# Patient Record
Sex: Female | Born: 1975 | Race: White | Hispanic: No | Marital: Married | Smoking: Never smoker
Health system: Southern US, Community
[De-identification: ages and names within clinical notes are randomized; demographics above are authoritative.]

## PROBLEM LIST (undated history)

## (undated) DIAGNOSIS — O24419 Gestational diabetes mellitus in pregnancy, unspecified control: Secondary | ICD-10-CM

## (undated) DIAGNOSIS — I10 Essential (primary) hypertension: Secondary | ICD-10-CM

## (undated) DIAGNOSIS — Z9109 Other allergy status, other than to drugs and biological substances: Secondary | ICD-10-CM

## (undated) DIAGNOSIS — Z8632 Personal history of gestational diabetes: Secondary | ICD-10-CM

## (undated) DIAGNOSIS — E669 Obesity, unspecified: Secondary | ICD-10-CM

## (undated) DIAGNOSIS — M1990 Unspecified osteoarthritis, unspecified site: Secondary | ICD-10-CM

## (undated) HISTORY — DX: Unspecified osteoarthritis, unspecified site: M19.90

## (undated) HISTORY — DX: Gestational diabetes mellitus in pregnancy, unspecified control: O24.419

## (undated) HISTORY — DX: Other allergy status, other than to drugs and biological substances: Z91.09

---

## 1998-01-19 ENCOUNTER — Other Ambulatory Visit: Admission: RE | Admit: 1998-01-19 | Discharge: 1998-01-19 | Payer: Self-pay | Admitting: Obstetrics and Gynecology

## 1998-11-01 ENCOUNTER — Emergency Department (HOSPITAL_COMMUNITY): Admission: EM | Admit: 1998-11-01 | Discharge: 1998-11-01 | Payer: Self-pay | Admitting: Emergency Medicine

## 1998-11-01 ENCOUNTER — Encounter: Payer: Self-pay | Admitting: Emergency Medicine

## 1999-04-18 ENCOUNTER — Other Ambulatory Visit: Admission: RE | Admit: 1999-04-18 | Discharge: 1999-04-18 | Payer: Self-pay | Admitting: Obstetrics and Gynecology

## 2000-08-19 ENCOUNTER — Other Ambulatory Visit: Admission: RE | Admit: 2000-08-19 | Discharge: 2000-08-19 | Payer: Self-pay | Admitting: Obstetrics and Gynecology

## 2001-09-08 HISTORY — PX: CYSTECTOMY: SUR359

## 2001-09-20 ENCOUNTER — Other Ambulatory Visit: Admission: RE | Admit: 2001-09-20 | Discharge: 2001-09-20 | Payer: Self-pay | Admitting: Obstetrics and Gynecology

## 2001-11-12 ENCOUNTER — Encounter: Payer: Self-pay | Admitting: Obstetrics and Gynecology

## 2001-11-12 ENCOUNTER — Encounter: Admission: RE | Admit: 2001-11-12 | Discharge: 2001-11-12 | Payer: Self-pay | Admitting: Obstetrics and Gynecology

## 2001-12-17 ENCOUNTER — Encounter (INDEPENDENT_AMBULATORY_CARE_PROVIDER_SITE_OTHER): Payer: Self-pay | Admitting: *Deleted

## 2001-12-17 ENCOUNTER — Ambulatory Visit (HOSPITAL_BASED_OUTPATIENT_CLINIC_OR_DEPARTMENT_OTHER): Admission: RE | Admit: 2001-12-17 | Discharge: 2001-12-17 | Payer: Self-pay | Admitting: Surgery

## 2002-04-06 ENCOUNTER — Encounter: Payer: Self-pay | Admitting: Emergency Medicine

## 2002-04-06 ENCOUNTER — Emergency Department (HOSPITAL_COMMUNITY): Admission: EM | Admit: 2002-04-06 | Discharge: 2002-04-06 | Payer: Self-pay | Admitting: Emergency Medicine

## 2002-10-20 ENCOUNTER — Other Ambulatory Visit: Admission: RE | Admit: 2002-10-20 | Discharge: 2002-10-20 | Payer: Self-pay | Admitting: Obstetrics and Gynecology

## 2003-11-09 ENCOUNTER — Other Ambulatory Visit: Admission: RE | Admit: 2003-11-09 | Discharge: 2003-11-09 | Payer: Self-pay | Admitting: Obstetrics and Gynecology

## 2004-12-24 ENCOUNTER — Other Ambulatory Visit: Admission: RE | Admit: 2004-12-24 | Discharge: 2004-12-24 | Payer: Self-pay | Admitting: Obstetrics and Gynecology

## 2006-11-09 ENCOUNTER — Inpatient Hospital Stay (HOSPITAL_COMMUNITY): Admission: AD | Admit: 2006-11-09 | Discharge: 2006-11-09 | Payer: Self-pay | Admitting: Obstetrics and Gynecology

## 2007-01-07 ENCOUNTER — Inpatient Hospital Stay (HOSPITAL_COMMUNITY): Admission: AD | Admit: 2007-01-07 | Discharge: 2007-01-07 | Payer: Self-pay | Admitting: Obstetrics and Gynecology

## 2007-01-14 ENCOUNTER — Encounter: Admission: RE | Admit: 2007-01-14 | Discharge: 2007-01-14 | Payer: Self-pay | Admitting: Obstetrics and Gynecology

## 2007-02-16 ENCOUNTER — Inpatient Hospital Stay (HOSPITAL_COMMUNITY): Admission: AD | Admit: 2007-02-16 | Discharge: 2007-02-17 | Payer: Self-pay | Admitting: Obstetrics and Gynecology

## 2007-02-21 ENCOUNTER — Inpatient Hospital Stay (HOSPITAL_COMMUNITY): Admission: AD | Admit: 2007-02-21 | Discharge: 2007-02-24 | Payer: Self-pay | Admitting: Obstetrics and Gynecology

## 2007-09-09 HISTORY — PX: CHOLECYSTECTOMY: SHX55

## 2008-01-30 ENCOUNTER — Encounter (INDEPENDENT_AMBULATORY_CARE_PROVIDER_SITE_OTHER): Payer: Self-pay | Admitting: General Surgery

## 2008-01-30 ENCOUNTER — Observation Stay (HOSPITAL_COMMUNITY): Admission: EM | Admit: 2008-01-30 | Discharge: 2008-01-31 | Payer: Self-pay | Admitting: Emergency Medicine

## 2010-01-14 ENCOUNTER — Observation Stay (HOSPITAL_COMMUNITY): Admission: EM | Admit: 2010-01-14 | Discharge: 2010-01-15 | Payer: Self-pay | Admitting: Emergency Medicine

## 2010-01-15 ENCOUNTER — Encounter (INDEPENDENT_AMBULATORY_CARE_PROVIDER_SITE_OTHER): Payer: Self-pay | Admitting: Internal Medicine

## 2010-01-21 ENCOUNTER — Ambulatory Visit: Payer: Self-pay | Admitting: Family Medicine

## 2010-01-21 DIAGNOSIS — R51 Headache: Secondary | ICD-10-CM

## 2010-01-21 DIAGNOSIS — E669 Obesity, unspecified: Secondary | ICD-10-CM | POA: Insufficient documentation

## 2010-01-21 DIAGNOSIS — R209 Unspecified disturbances of skin sensation: Secondary | ICD-10-CM

## 2010-01-21 DIAGNOSIS — R519 Headache, unspecified: Secondary | ICD-10-CM | POA: Insufficient documentation

## 2010-01-21 DIAGNOSIS — R32 Unspecified urinary incontinence: Secondary | ICD-10-CM

## 2010-01-22 ENCOUNTER — Ambulatory Visit: Payer: Self-pay | Admitting: Family Medicine

## 2010-01-22 LAB — CONVERTED CEMR LAB: Sed Rate: 29 mm/hr — ABNORMAL HIGH (ref 0–22)

## 2010-01-24 ENCOUNTER — Telehealth: Payer: Self-pay | Admitting: Family Medicine

## 2010-01-30 ENCOUNTER — Ambulatory Visit: Payer: Self-pay | Admitting: Family Medicine

## 2010-01-30 DIAGNOSIS — R059 Cough, unspecified: Secondary | ICD-10-CM | POA: Insufficient documentation

## 2010-01-30 DIAGNOSIS — F411 Generalized anxiety disorder: Secondary | ICD-10-CM | POA: Insufficient documentation

## 2010-01-30 DIAGNOSIS — R05 Cough: Secondary | ICD-10-CM

## 2010-01-31 ENCOUNTER — Telehealth: Payer: Self-pay | Admitting: Internal Medicine

## 2010-02-01 ENCOUNTER — Telehealth: Payer: Self-pay | Admitting: Family Medicine

## 2010-02-19 ENCOUNTER — Ambulatory Visit: Payer: Self-pay | Admitting: Family Medicine

## 2010-02-19 DIAGNOSIS — F329 Major depressive disorder, single episode, unspecified: Secondary | ICD-10-CM

## 2010-02-19 DIAGNOSIS — F339 Major depressive disorder, recurrent, unspecified: Secondary | ICD-10-CM | POA: Insufficient documentation

## 2010-08-21 ENCOUNTER — Ambulatory Visit: Payer: Self-pay | Admitting: Family Medicine

## 2010-10-08 NOTE — Assessment & Plan Note (Signed)
Summary: BRAND NEW PT/TO EST/CJR PT RSC/NJR   Vital Signs:  Patient profile:   35 year old female Menstrual status:  regular LMP:     01/07/2010 Height:      61.25 inches Weight:      200 pounds BMI:     37.62 Temp:     97.9 degrees F oral Pulse rate:   80 / minute Pulse rhythm:   regular Resp:     12 per minute BP sitting:   140 / 78  (left arm) Cuff size:   large  Vitals Entered By: Sid Falcon LPN (Jan 21, 2010 2:29 PM)  Nutrition Counseling: Patient's BMI is greater than 25 and therefore counseled on weight management options.  Serial Vital Signs/Assessments:  Time      Position  BP       Pulse  Resp  Temp     By 2:47 PM             122/82                         Sid Falcon LPN  LMP (date): 01/07/2010     Menstrual Status regular Enter LMP: 01/07/2010 Last PAP Result normal   History of Present Illness: Patient is seen to establish care.  Recent hospitalization. Patient presented with right-sided numbness. She was admitted 5-9 -2011 for one day. She had multiple studies including EEG, 2-D echocardiogram, carotid Dopplers, chest x-ray, CT of the head, an MRI scan of the head which were all unremarkable. Lab work included lipids with cholesterol 189 triglycerides 237 HDL 33. A1c 6.1%. CBC and chemistries normal. Patient's symptoms were resolving and she was discharged home. Symptoms have basically resolved since then.  She recalled onset prior to admission of right facial numbness which eventually generalized right upper extremity and lower extremity. Slight slurred speech. No confusion. No headache. No significant associated weakness.  Patient has history of gestational diabetes. Otherwise no chronic medical problems. Cholecystectomy 2009. No prescription medications.  Long hx of obesity much of her life.    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 Smoked Alcohol use-no Regular exercise-no One pregnancy, one live births  Risk Factors: Alcohol Use: 0 (01/21/2010) Exercise: no (01/21/2010)  Risk Factors: Smoking Status: never (01/21/2010)  Past Medical History: Arthritis Gestational diabetes Hay Fever, allergies  Past Surgical History: Cholecystectomy 2009 Cyst removed from left breast 2003  Family History: Family History of Arthritis, parents, grandparents Mothers sister, breast cancer paternal grandfather, heart disease maternal grandfather, stroke, HBP, diabetes  Social History: Occupation:  Futures trader Married Never Smoked Alcohol use-no Regular exercise-no One pregnancy, one live births Smoking Status:  never Occupation:  employed Does Patient Exercise:  no  Review of Systems  The patient denies anorexia, fever, weight loss, weight gain, vision loss, decreased hearing, chest pain, syncope, dyspnea on exertion, peripheral edema, prolonged cough, headaches, hemoptysis, abdominal pain, melena, hematochezia, and severe indigestion/heartburn.    Physical Exam  General:  Well-developed,well-nourished,in no acute distress; alert,appropriate and cooperative throughout examination Head:  Normocephalic and atraumatic without obvious  abnormalities. No apparent alopecia or balding. Eyes:  pupils equal, pupils round, and pupils reactive to light.   Mouth:  Oral mucosa and oropharynx without lesions or exudates.  Teeth in good repair. Neck:  No deformities, masses, or tenderness noted. Lungs:  Normal respiratory effort, chest  expands symmetrically. Lungs are clear to auscultation, no crackles or wheezes. Heart:  normal rate, regular rhythm, and no murmur.   Extremities:  no edema Neurologic:  alert & oriented X3, cranial nerves II-XII intact, strength normal in all extremities, sensation intact to light touch, gait normal, DTRs symmetrical and normal, finger-to-nose normal, and toes down bilaterally on Babinski.   Skin:  no rashes.   Cervical Nodes:  No lymphadenopathy noted Psych:  normally interactive, good eye contact, not anxious appearing, and not depressed appearing.     Impression & Recommendations:  Problem # 1:  PARESTHESIA (ICD-782.0) resolved.  Etiology unclear.  no evidence for CVA.  ?vasculitic. Orders: T-Antinuclear Antib (ANA) [phone]) Venipuncture (27253) TLB-Sedimentation Rate (ESR) (85652-ESR)  Problem # 2:  OBESITY (ICD-278.00) discussed weight loss.  At risk for Type 2 DM with hx gestational diabetes.  Complete Medication List: 1)  One-a-day Womens Formula Tabs (Multiple vitamins-calcium) .... Once daily 2)  Calcium Carbonate 600 Mg Tabs (Calcium carbonate) .... Once daily 3)  Allegra 180 Mg Tabs (Fexofenadine hcl) .... As needed 4)  Vit C  .... Once daily  Patient Instructions: 1)  follow up immediately if you have any recurrent right face or extremity numbness or weakness or any other focal neurologic symptoms  Preventive Care Screening  Pap Smear:    Date:  04/08/2008    Results:  normal

## 2010-10-08 NOTE — Progress Notes (Signed)
  Phone Note Call from Patient   Caller: Patient Summary of Call: persistent depressive symptoms.  Pt feels need to consider antidepressant at this time. No suicidal ideation.  Start sertraline and follow up to reassess in 3 weeks. Initial call taken by: Evelena Peat MD,  Feb 01, 2010 2:02 PM    New/Updated Medications: SERTRALINE HCL 50 MG TABS (SERTRALINE HCL) one by mouth once daily Prescriptions: SERTRALINE HCL 50 MG TABS (SERTRALINE HCL) one by mouth once daily  #30 x 0   Entered and Authorized by:   Evelena Peat MD   Signed by:   Evelena Peat MD on 02/01/2010   Method used:   Electronically to        [facility]  [phone]* (retail)       [address]       [place], [place]  [address]       Ph: [phone] or [phone]       Fax: [phone]   RxID:   [id number]

## 2010-10-08 NOTE — Progress Notes (Signed)
Summary: cough  Phone Note Call from Patient   Caller: Patient Call For: Evelena Peat MD Summary of Call: [phone] Pt has been vomiting since taking the Hydrocone cough syrup.  Can we give her a different RX? Nicolette Bang ( [facility])  Initial call taken by: Lynann Beaver CMA,  Jan 31, 2010 10:36 AM  Follow-up for Phone Call        generic tessalon 200 mg  #30 one every 8 hours Follow-up by: Gordy Savers  MD,  Jan 31, 2010 10:39 AM    New/Updated Medications: TESSALON 200 MG CAPS (BENZONATATE) one by mouth q 8 hours. Prescriptions: TESSALON 200 MG CAPS (BENZONATATE) one by mouth q 8 hours.  #30 x 0   Entered by:   Lynann Beaver CMA   Authorized by:   Gordy Savers  MD   Signed by:   Lynann Beaver CMA on 01/31/2010   Method used:   Electronically to        [facility]  [phone]* (retail)       [address]       [place], [place]  [address]       Ph: [phone] or [phone]       Fax: [phone]   RxID:   [id number]  Pt. notifed.
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
## 2010-10-08 NOTE — Assessment & Plan Note (Signed)
Summary: recurrent symptoms/dm   Vital Signs:  Patient profile:   35 year old female Menstrual status:  regular Temp:     98.2 degrees F oral BP sitting:   110 / 74  (left arm) Cuff size:   large  Vitals Entered By: Sid Falcon LPN (Jan 22, 2010 10:13 AM)   History of Present Illness: Patient seen with persistent dry cough. Just seen yesterday and referto note yesterday for details regarding recent hospitalization. Last night around 7 PM she noticed possible episode of her heart racing but denied any syncope or dizziness. She had tingling involving right face and left ear but no weakness and no recurrent slurred speech. She hady bifrontal headache. Her cough is in mostly dry and not relieved with Robitussin or Mucinex. She has frequent postnasal drip symptoms and congestion. Inconsistent use of antihistamine. No recent decongestant use. Denies GERD symptoms. No purulent nasal discharge. Denies fever or chills. Recent chest x-ray unremarkable.  Denies any recurrent extremity tingling, numbness, or weakness. Nonsmoker.  Allergies (verified): No Known Drug Allergies  Past History:  Past Surgical History: Last updated: 01/21/2010 Cholecystectomy 2009 Cyst removed from left breast 2003  Review of Systems      See HPI  Physical Exam  General:  Well-developed,well-nourished,in no acute distress; alert,appropriate and cooperative throughout examination Ears:  External ear exam shows no significant lesions or deformities.  Otoscopic examination reveals clear canals, tympanic membranes are intact bilaterally without bulging, retraction, inflammation or discharge. Hearing is grossly normal bilaterally. Nose:  External nasal examination shows no deformity or inflammation. Nasal mucosa are pink and moist without lesions or exudates. Mouth:  Oral mucosa and oropharynx without lesions or exudates.  Teeth in good repair. Neck:  No deformities, masses, or tenderness noted. Lungs:  Normal  respiratory effort, chest expands symmetrically. Lungs are clear to auscultation, no crackles or wheezes. Heart:  Normal rate and regular rhythm. S1 and S2 normal without gallop, murmur, click, rub or other extra sounds. Neurologic:  alert & oriented X3, cranial nerves II-XII intact, and strength normal in all extremities.     Impression & Recommendations:  Problem # 1:  COUGH (ICD-786.2) suspect this may be related to allergic postnasal drip. Recent chest x-ray unremarkable. Regular use of antihistamine and add fluticasone nasal.  Complete Medication List: 1)  One-a-day Womens Formula Tabs (Multiple vitamins-calcium) .... Once daily 2)  Calcium Carbonate 600 Mg Tabs (Calcium carbonate) .... Once daily 3)  Allegra 180 Mg Tabs (Fexofenadine hcl) .... As needed 4)  Vit C  .... Once daily 5)  Fluticasone Propionate 50 Mcg/act Susp (Fluticasone propionate) .... 2 sprays per nostril once daily as needed  Patient Instructions: 1)  use Claritin or Allegra once daily regularly for the next couple of weeks 2)  Add nasal allergy spray daily 3)  Be in touch if nasal congestive symptoms or cough not improving over the next couple of weeks Prescriptions: FLUTICASONE PROPIONATE 50 MCG/ACT SUSP (FLUTICASONE PROPIONATE) 2 sprays per nostril once daily as needed  #1 x 5   Entered and Authorized by:   Evelena Peat MD   Signed by:   Evelena Peat MD on 01/22/2010   Method used:   Electronically to        [facility]  [phone]* (retail)       [address]       [place], [place]  [address]       Ph: [phone] or [phone]  Fax: [phone]   RxID:   [id number]

## 2010-10-08 NOTE — Assessment & Plan Note (Signed)
Summary: Follow up/cb/pt rsc/cjr   Vital Signs:  Patient profile:   35 year old female Menstrual status:  regular Weight:      197 pounds Temp:     98.1 degrees F oral BP sitting:   120 / 80  (left arm) Cuff size:   large  Vitals Entered By: Sid Falcon LPN (February 19, 2010 8:49 AM) CC: Follow-up visit, new med   History of Present Illness: Cough improved with Tessalon.  No fever, chills, or dyspnea.  Recent increase in depressive symptoms.  We started Zoloft and she is improved-better sleep, less anxious, and less depressed .  No side effects from med.   No suicidal ideation.  Improved motivation and more positive outlook.  Allergies (verified): No Known Drug Allergies  Past History:  Past Medical History: Last updated: 01/21/2010 Arthritis Gestational diabetes Hay Fever, allergies  Review of Systems  The patient denies anorexia, weight loss, and weight gain.    Physical Exam  General:  Well-developed,well-nourished,in no acute distress; alert,appropriate and cooperative throughout examination Lungs:  Normal respiratory effort, chest expands symmetrically. Lungs are clear to auscultation, no crackles or wheezes. Heart:  Normal rate and regular rhythm. S1 and S2 normal without gallop, murmur, click, rub or other extra sounds. Psych:  normally interactive, good eye contact, not anxious appearing, and not depressed appearing.     Impression & Recommendations:  Problem # 1:  COUGH, CHRONIC (ICD-786.2) Assessment Improved almost resolved.  Problem # 2:  DEPRESSION (ICD-311) Assessment: Improved cont Zoloft and reassess in 6 months. Her updated medication list for this problem includes:    Sertraline Hcl 50 Mg Tabs (Sertraline hcl) ..... One by mouth once daily  Complete Medication List: 1)  One-a-day Womens Formula Tabs (Multiple vitamins-calcium) .... Once daily 2)  Calcium Carbonate 600 Mg Tabs (Calcium carbonate) .... Once daily 3)  Allegra 180 Mg Tabs  (Fexofenadine hcl) .... As needed 4)  Vit C  .... Once daily 5)  Fluticasone Propionate 50 Mcg/act Susp (Fluticasone propionate) .... 2 sprays per nostril once daily as needed 6)  Hydrocodone-homatropine 5-1.5 Mg/65ml Syrp (Hydrocodone-homatropine) .... One tsp by mouth q4-6 hours as needed cough 7)  Tessalon 200 Mg Caps (Benzonatate) .... One by mouth q 8 hours. 8)  Sertraline Hcl 50 Mg Tabs (Sertraline hcl) .... One by mouth once daily  Patient Instructions: 1)  Please schedule a follow-up appointment in 6 months .  Prescriptions: SERTRALINE HCL 50 MG TABS (SERTRALINE HCL) one by mouth once daily  #30 x 6   Entered and Authorized by:   Evelena Peat MD   Signed by:   Evelena Peat MD on 02/19/2010   Method used:   Electronically to        [facility]  [phone]* (retail)       [address]       [place], [place]  [address]       Ph: [phone] or [phone]       Fax: [phone]   RxID:   [id number]

## 2010-10-08 NOTE — Progress Notes (Signed)
Summary: Lab results  Phone Note Call from Patient   Caller: Patient Call For: Evelena Peat MD Summary of Call: Pt. is calling for lab results. [phone] Initial call taken by: Lynann Beaver CMA,  Jan 24, 2010 9:09 AM  Follow-up for Phone Call        Pt informed Follow-up by: Sid Falcon LPN,  Jan 24, 2010 12:08 PM

## 2010-11-26 LAB — CBC
HCT: 39.5 % (ref 36.0–46.0)
Hemoglobin: 13.6 g/dL (ref 12.0–15.0)
MCHC: 34.5 g/dL (ref 30.0–36.0)
RBC: 4.62 MIL/uL (ref 3.87–5.11)
WBC: 8.1 10*3/uL (ref 4.0–10.5)

## 2010-11-26 LAB — CK TOTAL AND CKMB (NOT AT ARMC)
CK, MB: 1.9 ng/mL (ref 0.3–4.0)
CK, MB: 2.5 ng/mL (ref 0.3–4.0)
Relative Index: 1.6 (ref 0.0–2.5)
Total CK: 142 U/L (ref 7–177)
Total CK: 159 U/L (ref 7–177)

## 2010-11-26 LAB — LIPID PANEL
Cholesterol: 189 mg/dL (ref 0–200)
HDL: 33 mg/dL — ABNORMAL LOW (ref >39)
Total CHOL/HDL Ratio: 5.7 RATIO
VLDL: 47 mg/dL — ABNORMAL HIGH (ref 0–40)

## 2010-11-26 LAB — URINALYSIS, ROUTINE W REFLEX MICROSCOPIC
Ketones, ur: NEGATIVE mg/dL
Leukocytes, UA: NEGATIVE
Nitrite: NEGATIVE

## 2010-11-26 LAB — COMPREHENSIVE METABOLIC PANEL
CO2: 23 mEq/L (ref 19–32)
GFR calc non Af Amer: >60 mL/min (ref >60)
Potassium: 4.8 mEq/L (ref 3.5–5.1)
Total Bilirubin: 0.6 mg/dL (ref 0.3–1.2)
Total Protein: 7.8 g/dL (ref 6.0–8.3)

## 2010-11-26 LAB — GLUCOSE, CAPILLARY: Glucose-Capillary: 105 mg/dL — ABNORMAL HIGH (ref 70–99)

## 2010-11-26 LAB — PROTIME-INR
INR: 1 (ref 0.00–1.49)
Prothrombin Time: 13.1 seconds (ref 11.6–15.2)

## 2010-11-26 LAB — APTT: aPTT: 29 seconds (ref 24–37)

## 2010-11-26 LAB — URINE MICROSCOPIC-ADD ON

## 2010-11-26 LAB — CARDIAC PANEL(CRET KIN+CKTOT+MB+TROPI)
CK, MB: 1.9 ng/mL (ref 0.3–4.0)
Relative Index: 1.6 (ref 0.0–2.5)
Troponin I: 0.01 ng/mL (ref 0.00–0.06)

## 2010-11-26 LAB — TROPONIN I: Troponin I: 0.01 ng/mL (ref 0.00–0.06)

## 2010-11-26 LAB — HEMOGLOBIN A1C: Hgb A1c MFr Bld: 6.1 % — ABNORMAL HIGH (ref <5.7)

## 2010-11-26 LAB — DIFFERENTIAL
Eosinophils Absolute: 0.1 10*3/uL (ref 0.0–0.7)
Monocytes Absolute: 0.5 10*3/uL (ref 0.1–1.0)
Neutrophils Relative %: 64 % (ref 43–77)

## 2011-01-21 NOTE — H&P (Signed)
[name], Anderson               ACCOUNT NO.:  [id number]   MEDICAL RECORD NO.:  [id number]          PATIENT TYPE:  OBV   LOCATION:  0098                         FACILITY:  [facility]   PHYSICIAN:  Anselm Pancoast. Weatherly, M.D.DATE OF BIRTH:  10/06/1975   DATE OF ADMISSION:  01/30/2008  DATE OF DISCHARGE:                              HISTORY & PHYSICAL   PREOPERATIVE DIAGNOSIS:  Acute cholecystitis with stones.   POSTOP DIAGNOSIS:  Acute cholecystitis with stones.   OPERATIONS:  Laparoscopic cholecystectomy with cholangiogram under  general anesthesia.   SURGEONS:  1. Dr. Consuello Bossier.  2. Assistant, Dr. Baruch Merl.   CHIEF COMPLAINT:  Epigastric pain.   HISTORY:  Tracey Anderson is a 35 year old Caucasian female who presented  to the emergency room at approximately 3:30 a.m. with the following  history:  She had had previous episodes of pain that had subsided, all  kind of epigastric and occurred postprandially.  In the evening last  night she had a pizza and went to bed and then started having severe  pain first in the epigastric area and then kind of in her chest.  It was  severe and then it would kind of come intermittently.  When she  presented to the ER, she was seen by the ER physician who found that she  was not febrile.  Had laboratory studies drawn.  White count was 8,700  with 73% neutrophils, and then a CMET was performed.  The CMET showed  mildly elevated SGOT and SGPT; bilirubin was normal, and her glucose was  unremarkable.  EKG showed normal sinus rhythm, and a chest x-ray was  also performed.  With this being completed, she was given Dilaudid, and  an ultrasound of the gallbladder was obtained.  This showed a distended  gallbladder with small stones in it, and one appeared up over the neck  of the gallbladder.  The common bile duct was not definitely dilated,  and a surgical consultation was called this morning at approximately  7:15.  They had originally  thought about letting her go home, but then  she appeared to be having reoccurring episodes of significant pain.  When I saw her probably at about 7:45, she was still uncomfortable; said  the pressure was reoccurring.  Of course she had been n.p.o. since her  pizza last night and is in agreement to proceed with the laparoscopic  cholecystectomy at this time.  Her past medical history is  noncontributory with the exception that she has been on birth control  pills.  She has an 87-month-old daughter.  These episodes of pain  started occurring after her pregnancy.  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   ALLERGIES:  None.   CHRONIC MEDICATIONS:  None.   PHYSICAL EXAMINATION:  When I saw her at approximately 8 o'clock, she  was afebrile.  Pulse was not elevated.  She was having pressure symptoms  again in the upper abdomen but not what I would really consider acutely  tender in the right upper quadrant.  She has kind of a relaxed abdomen  as  if she has had a recent pregnancy, and the stretch marks are  resolving.  I did not do a pelvic or rectal examination.  There is no  significant pain radiating to the small of her back.  Her lipase and  amylase were unremarkable when measured when she first came.   IMPRESSION:  Symptomatic gallstones, possibly passage of the common duct  stone.  However, I would recommend we proceed on with a laparoscopic  cholecystectomy and cholangiogram.  She is aware of the possibility she  may need an ERCP if there is a common duct stone.  Antibiotics ordered.  OR  has been notified.           ______________________________  Anselm Pancoast. Zachery Dakins, M.D.     WJW/MEDQ  D:  01/30/2008  T:  01/30/2008  Job:  [id number]

## 2011-01-21 NOTE — Op Note (Signed)
NAMETIMEKA, GOETTE               ACCOUNT NO.:  [id number]   MEDICAL RECORD NO.:  [id number]          PATIENT TYPE:  OBV   LOCATION:  1527                         FACILITY:  [facility]   PHYSICIAN:  Anselm Pancoast. Weatherly, M.D.DATE OF BIRTH:  July 15, 1976   DATE OF PROCEDURE:  DATE OF DISCHARGE:                               OPERATIVE REPORT   PREOPERATIVE DIAGNOSIS:  Acute cholecystitis with stones.   POSTOPERATIVE DIAGNOSIS:  Acute cholecystitis with stones.   OPERATION:  Laparoscopic cholecystectomy with cholangiogram.   ANESTHESIA:  General.   HISTORY:  Tracey Anderson is a 35 year old Caucasian female who is now  postpartum, who has been having episodes of epigastric pain that would  subside after a few hours,  usually in the evening, and had one last  evening after eating pizza. The pain can become very intense to her  chest and approximately at 2 a.m. she came to the emergency room and was  seen by the ER physician at approximately 3:30.  Laboratory studies were  obtained and her SGOT and SGPT were elevated, but bilirubin was not, and  her white count was not elevated.  She was treated with pain medication.  EKG and chest x-ray ruled out any cardiac or chest problems  and an  ultrasound was performed about 6 a.m. which showed a distended  gallbladder with small stones.  Clinically, they discussed the thought  that she could go home, but then the pain reoccurred and I was asked to  see her at approximately 7 a.m.  The patient is in agreement to go ahead  and proceed with a laparoscopic cholecystectomy with cholangiogram and  understands that if  there is a common duct stone she may need an ERCP.  The patient was given 3 g of Unasyn and PAS stockings.   DESCRIPTION OF PROCEDURE:  She was taken to the operative suite,  induction of general anesthesia, endotracheal tube, oral tube to the  stomach and the abdomen was prepped with Betadine solution and draped in  a sterile manner.  She  still has got a little bit of the fatty tissue  from her pregnancy and a small incision was made below the umbilicus and  fascia was eventually identified and batched up and picked up between 2  Kochers and a small  opening carefully made through the peritoneal  cavity.  A pursestring suture of 0-Vicryl was placed and a Hasson  cannula introduced.  The gallbladder was distended and slightly  erythematous and a little bit edematous, but certainly not that of a  horrible acute cholecystitis.  The operative trocar was placed under  direct vision.  Two 5-mm trocars were placed without difficulty.  The  guidewire was tracted up in the abdomen with a hook cautery and I  carefully opened up the peritoneum over the proximal portion of the  gallbladder cystic duct and encompassed a very short cystic duct.  I  placed a clip on the cystic duct gallbladder junction and a small  opening made in the cystic duct and a Cook catheter introduced.  It was  held in place with  a clip and then cholangiogram was obtained.  The bile  duct was a little prominent but there is dye going into the distal  common bile duct.  We repositioned so we could centered it just on the  distal portions since the intrahepatic radicals had been seen at the  first injection and then repeat injections and then I could certainly  see flow going and no obvious stones impacted in the distal common bile  duct.  The catheter was then removed, three clips were placed on the  short cystic duct, taking care that the cystic duct and the common  hepatic junction was not compromised and then the cystic duct divided.  The cystic artery had been identified and doubly clipped, proximal  __________and the distal divided and then the hook electrocautery was  used to free up the gallbladder from its bed.  Good hemostasis was  obtained and the gallbladder was placed in the EndoCatch bag.  The  camera was switched to the upper 10-mm port.  The bag  containing the  gallbladder was drawn through the 5-mm port at the fascial defect of the  umbilicus and then an additional figure-of-eight 0-Vicryl suture placed  in addition to the first and both tied.  A little bit of irrigating  fluid  had been used to aspirate into the  __________clips and no  bleeding from the bile duct was seen coming from the liver bed and the  carbon dioxide was released and the 5-mm ports withdrawn.  The  subcutaneous wounds were closed with 3-0 Vicryl, 4-0 Vicryl at the  umbilicus and subxiphoid and Monocryl at the medial subcutaneous area.  Benzoin and Steri-Strips were placed on the skin.  The patient tolerated  the procedure nicely and we will let her decide whether she goes home  this evening or whether we will plan on keeping her overnight and go  home in the morning.  I will repeat a set of liver tests down the road  and I do not think there is any stone in the common bile duct at this  time, but clinically I think she probably did pass a small stone with  this severe rest chest pain, etc. that she had experienced..           ______________________________  Anselm Pancoast. Zachery Dakins, M.D.     WJW/MEDQ  D:  01/30/2008  T:  01/30/2008  Job:  [id number]

## 2011-01-24 NOTE — Op Note (Signed)
[place]  Patient:    Tracey Anderson, Tracey Anderson Visit Number: [id number] MRN: [id number]          Service Type: DSU Location: [facility] Attending Physician:  Charlton Haws Dictated by:   Currie Paris, M.D. Proc. Date: 12/17/01 Admit Date:  12/17/2001 Discharge Date: 12/17/2001   CC:         Juluis Mire, M.D.  [facility]   Operative Report  ACCOUNT NO. [id number]. CCS [id number].  PREOPERATIVE DIAGNOSIS:  Left breast mass.  POSTOPERATIVE DIAGNOSIS:  Left breast mass.  PROCEDURE:  Excision of left breast mass.  SURGEON:  Currie Paris, M.D.  ANESTHESIA:  MAC.  CLINICAL HISTORY:  This patient is a 35 year old with a fairly well- circumscribed but somewhat irregular mass in the upper inner quadrant of the left breast just almost in the midline of the breast, about two fingerbreadths above the areolar margin.  DESCRIPTION OF PROCEDURE:  The patient was seen in the holding area and had no further questions.  In the operating room the mass was identified and marked. She was then given IV sedation.  The breast was prepped with Betadine and draped as a sterile field.  Xylocaine 1% was infiltrated over the mass and once adequate local was achieved, a curvilinear incision made over the mass.  Skin and subcutaneous tissues were divided with the cautery and as I got down to where I could palpate the mass, I put a 3-0 Vicryl through it to use as a traction suture. The mass was excised in toto with a small rim of normal breast tissue and fatty tissue around it.  This was sent for permanent section.  The wound was checked for hemostasis, and that was achieved with the cautery. It was then closed in layers with 3-0 Vicryl, followed by 4-0 Monocryl subcuticular plus Steri-Strips.  The patient tolerated the procedure well. There were no operative complications.  All counts were correct. Dictated by:   Currie Paris,  M.D. Attending Physician:  Charlton Haws DD:  12/17/01 TD:  12/18/01 Job: [id number]

## 2011-06-03 LAB — ABO/RH: RH Type: POSITIVE

## 2011-06-03 LAB — GC/CHLAMYDIA PROBE AMP, GENITAL: Chlamydia: NEGATIVE

## 2011-06-03 LAB — RUBELLA ANTIBODY, IGM: Rubella: IMMUNE

## 2011-06-04 LAB — COMPREHENSIVE METABOLIC PANEL
ALT: 46 — ABNORMAL HIGH
Albumin: 3.3 — ABNORMAL LOW
BUN: 14
BUN: 4 — ABNORMAL LOW
CO2: 25
Calcium: 9.1
Creatinine, Ser: 0.65
Creatinine, Ser: 0.65
GFR calc Af Amer: >60
GFR calc non Af Amer: >60
Glucose, Bld: 149 — ABNORMAL HIGH
Sodium: 140
Total Protein: 6.1
Total Protein: 6.7

## 2011-06-04 LAB — CBC
HCT: 38.5
Hemoglobin: 13.1
MCHC: 34
MCV: 83.7
RBC: 4.6
RDW: 12.7

## 2011-06-04 LAB — POCT CARDIAC MARKERS
CKMB, poc: 1.2
Operator id: 294591
Troponin i, poc: <0.05
Troponin i, poc: <0.05

## 2011-06-04 LAB — DIFFERENTIAL
Basophils Relative: 0
Eosinophils Absolute: 0.1
Eosinophils Relative: 1
Lymphs Abs: 1.8
Monocytes Relative: 6

## 2011-06-04 LAB — LIPASE, BLOOD: Lipase: 39

## 2011-06-25 LAB — CBC
HCT: 34.5 — ABNORMAL LOW
HCT: 35.2 — ABNORMAL LOW
Hemoglobin: 11.5 — ABNORMAL LOW
Hemoglobin: 11.7 — ABNORMAL LOW
MCHC: 33.2
MCHC: 33.4
MCV: 85.9
MCV: 86.4
RBC: 4.09
RDW: 14.6 — ABNORMAL HIGH

## 2011-06-26 LAB — URINALYSIS, ROUTINE W REFLEX MICROSCOPIC
Bilirubin Urine: NEGATIVE
Hgb urine dipstick: NEGATIVE
Ketones, ur: NEGATIVE
Specific Gravity, Urine: 1.02
Urobilinogen, UA: 1

## 2011-09-09 NOTE — L&D Delivery Note (Signed)
Delivery Note  At 1:22 AM a viable female was delivered via Vaginal, Spontaneous Delivery (Presentation: ;  ).  APGAR: 4, 8; weight .   Placenta status: spont >>>path  , .  Cord:  with the following complications: .  Cord pH: sent  tight nuchal cord X 2, clamped + cut, bulb suction + assis vent w/ O2 until NICU team arrived Anesthesia: Epidural  Episiotomy: None Lacerations: first deg Suture Repair: 3.0 chromic Est. Blood Loss (mL): 300cc  Mom to postpartum.  Baby to nursery-stable.  Meriel Pica 12/07/2011, 1:37 AM

## 2011-10-16 ENCOUNTER — Other Ambulatory Visit: Payer: Self-pay

## 2011-10-16 ENCOUNTER — Emergency Department (HOSPITAL_COMMUNITY)
Admission: EM | Admit: 2011-10-16 | Discharge: 2011-10-16 | Disposition: A | Discharge status: Home / Self Care | Payer: BC Managed Care – PPO | Attending: Emergency Medicine | Admitting: Emergency Medicine

## 2011-10-16 ENCOUNTER — Encounter (HOSPITAL_COMMUNITY): Payer: Self-pay

## 2011-10-16 DIAGNOSIS — Z79899 Other long term (current) drug therapy: Secondary | ICD-10-CM | POA: Insufficient documentation

## 2011-10-16 DIAGNOSIS — M129 Arthropathy, unspecified: Secondary | ICD-10-CM | POA: Insufficient documentation

## 2011-10-16 DIAGNOSIS — O99891 Other specified diseases and conditions complicating pregnancy: Secondary | ICD-10-CM | POA: Insufficient documentation

## 2011-10-16 DIAGNOSIS — R42 Dizziness and giddiness: Secondary | ICD-10-CM | POA: Insufficient documentation

## 2011-10-16 HISTORY — DX: Personal history of gestational diabetes: Z86.32

## 2011-10-16 LAB — CBC
MCH: 28.2 pg (ref 26.0–34.0)
MCV: 85.4 fL (ref 78.0–100.0)
Platelets: 188 10*3/uL (ref 150–400)
RBC: 4.18 MIL/uL (ref 3.87–5.11)

## 2011-10-16 LAB — DIFFERENTIAL
Basophils Relative: 0 % (ref 0–1)
Eosinophils Relative: 1 % (ref 0–5)
Lymphs Abs: 1.7 10*3/uL (ref 0.7–4.0)
Monocytes Absolute: 0.4 10*3/uL (ref 0.1–1.0)

## 2011-10-16 LAB — URINALYSIS, ROUTINE W REFLEX MICROSCOPIC
Bilirubin Urine: NEGATIVE
Glucose, UA: NEGATIVE mg/dL
Ketones, ur: 15 mg/dL — AB
pH: 6.5 (ref 5.0–8.0)

## 2011-10-16 MED ORDER — SODIUM CHLORIDE 0.9 % IV BOLUS (SEPSIS)
500.0000 mL | Freq: Once | INTRAVENOUS | Status: AC
  Administered 2011-10-16: 500 mL via INTRAVENOUS

## 2011-10-16 NOTE — ED Notes (Signed)
RR OB RN called to come and see pt. Materials at the bedside.

## 2011-10-16 NOTE — ED Notes (Signed)
Per EMS pt is [redacted] weeks pregnant and has hx gestational diabetes with first pregnancy. Called out for dizziness, lightheadedness, and blurred vision. Pt's blood sugar at home was 93, EMS checked it and it was 85. Pt's last check up was on January 31st. No complications with pregnancy. Pt was having severe back pain yesterday. Pt sts baby is moving appropriately.

## 2011-10-16 NOTE — Progress Notes (Signed)
OB RR RN at bedside to assess pt. Pt c/o dizziness this am and blurred vision. None at this time. Pt denies vaginal bleeding, leaking of fluid and reports positive fetal movement. Pt is seen by Dr. Arelia Sneddon, last visit 10/09/2011. Pt states that she was tested for Gestational DM with 1 hour glucola and did not pass. Pt has not scheduled a repeat evaluation. Pt has history of GDM with previous pregnancy in 2008.

## 2011-10-16 NOTE — Progress Notes (Signed)
[facility] ED called OB RR RN about 29 week pt regarding dizziness and blurred vision.

## 2011-10-16 NOTE — ED Notes (Signed)
Pt sts the symptoms are gone for now. She was surprised when she checked her sugar this morning and found that her sugar had dropped from the time that she had eaten to the time that EMS arrived and checked it again.

## 2011-10-16 NOTE — Progress Notes (Signed)
Rolly Salter, ED RN notified of pt being cleared OB related but if concerns arise to call Dr. Vincente Poli regarding pregnancy.

## 2011-10-16 NOTE — ED Provider Notes (Signed)
Medical screening examination/treatment/procedure(s) were conducted as a shared visit with non-physician practitioner(s) and myself.  I personally evaluated the patient during the encounter   Tracey Anderson L Tracey Sartwell, MD 10/16/11 1520 

## 2011-10-16 NOTE — ED Provider Notes (Signed)
History     CSN: [id number]  Arrival date & time 10/16/11  1022   First MD Initiated Contact with Patient 10/16/11 1101      Chief Complaint  Patient presents with  . Dizziness    (Consider location/radiation/quality/duration/timing/severity/associated sxs/prior treatment) Patient is a 36 y.o. female presenting with weakness. The history is provided by the patient.  Weakness Primary symptoms do not include fever. The symptoms began 12 to 24 hours ago. The symptoms are unchanged. Context: she reports becoming lightheaded when she goes to stand, better at rest. No syncope.   Associated symptoms comments: She is [redacted] weeks pregnant in an uncomplicated pregnancy and denies having abdominal pain, vaginal bleeding or discharge. Baby remains active. She reports gestational diabetes with first pregnancy and that her CBG this morning after onset of symptoms was 95. Later when checked in was 85. No vomiting, nausea, fever, or other symptoms..    Past Medical History  Diagnosis Date  . Arthritis   . Gestational diabetes   . Environmental allergies   . Hx gestational diabetes     Past Surgical History  Procedure Date  . Cholecystectomy 2009  . Cystectomy 2003    left breast    Family History  Problem Relation Age of Onset  . Cancer Mother     breast cancer  . Cancer Sister     breast cancer  . Hypertension Maternal Grandfather   . Diabetes Maternal Grandfather   . Stroke Maternal Grandfather   . Heart disease Paternal Grandfather     History  Substance Use Topics  . Smoking status: Never Smoker   . Smokeless tobacco: Not on file  . Alcohol Use: No    OB History    Grav Para Term Preterm Abortions TAB SAB Ect Mult Living   2 1 1   0     1      Review of Systems  Constitutional: Negative for fever and chills.  HENT: Negative.   Respiratory: Negative.   Cardiovascular: Negative.   Gastrointestinal: Negative.   Musculoskeletal: Negative.   Skin: Negative.     Neurological: Positive for light-headedness.    Allergies  Review of patient's allergies indicates no known allergies.  Home Medications   Current Outpatient Rx  Name Route Sig Dispense Refill  . VITAMIN C PO Oral Take by mouth.      . BENZONATATE 200 MG PO CAPS Oral Take 200 mg by mouth every 8 (eight) hours.      Marland Kitchen CALCIUM CARBONATE 600 MG PO TABS Oral Take 600 mg by mouth daily.      Marland Kitchen FEXOFENADINE HCL 180 MG PO TABS Oral Take 180 mg by mouth as needed.      Marland Kitchen FLUTICASONE PROPIONATE 50 MCG/ACT NA SUSP  2 sprays per nostril once daily as needed     . HYDROCODONE-HOMATROPINE 5-1.5 MG/5ML PO SYRP  One tsp by mouth every 4-6 hours as needed for cough     . ONE-A-DAY WOMENS PO TABS Oral Take 1 tablet by mouth daily.      Marland Kitchen PRENATAL MULTIVITAMIN CH Oral Take 1 tablet by mouth daily.    . SERTRALINE HCL 50 MG PO TABS Oral Take 50 mg by mouth daily.        BP 104/51  Pulse 95  Temp(Src) 97.5 F (36.4 C) (Oral)  Resp 18  Ht 5\' 1"  (1.549 m)  Wt 209 lb 4 oz (94.915 kg)  BMI 39.54 kg/m2  SpO2 100%  Physical Exam  Constitutional: She is oriented to person, place, and time. She appears well-developed and well-nourished.  HENT:  Head: Normocephalic.  Neck: Normal range of motion. Neck supple.  Cardiovascular: Normal rate and regular rhythm.   Pulmonary/Chest: Effort normal and breath sounds normal.  Abdominal: Soft. Bowel sounds are normal. There is no tenderness. There is no rebound and no guarding.       Abdomen gravid and nontender.  Musculoskeletal: Normal range of motion.  Neurological: She is alert and oriented to person, place, and time. No cranial nerve deficit. Coordination normal.  Skin: Skin is warm and dry. No rash noted.  Psychiatric: She has a normal mood and affect.    ED Course  Procedures (including critical care time)  Labs Reviewed - No data to display No results found.   No diagnosis found.    MDM  OB response nurse reports fetal monitor appears  normal. Baby active, normal heart rate, and no contractions.        Rodena Medin, PA-C 10/16/11 1327

## 2011-12-05 ENCOUNTER — Inpatient Hospital Stay (HOSPITAL_COMMUNITY)
Admission: AD | Admit: 2011-12-05 | Discharge: 2011-12-08 | DRG: 373 | Disposition: A | Discharge status: Home / Self Care | Payer: BC Managed Care – PPO | Source: Ambulatory Visit | Attending: Obstetrics and Gynecology | Admitting: Obstetrics and Gynecology

## 2011-12-05 DIAGNOSIS — E669 Obesity, unspecified: Secondary | ICD-10-CM

## 2011-12-05 DIAGNOSIS — F411 Generalized anxiety disorder: Secondary | ICD-10-CM

## 2011-12-05 DIAGNOSIS — O321XX0 Maternal care for breech presentation, not applicable or unspecified: Secondary | ICD-10-CM

## 2011-12-05 DIAGNOSIS — R209 Unspecified disturbances of skin sensation: Secondary | ICD-10-CM

## 2011-12-05 DIAGNOSIS — F329 Major depressive disorder, single episode, unspecified: Secondary | ICD-10-CM

## 2011-12-05 DIAGNOSIS — R32 Unspecified urinary incontinence: Secondary | ICD-10-CM

## 2011-12-05 DIAGNOSIS — R05 Cough: Secondary | ICD-10-CM

## 2011-12-05 DIAGNOSIS — R51 Headache: Secondary | ICD-10-CM

## 2011-12-06 ENCOUNTER — Encounter (HOSPITAL_COMMUNITY): Payer: Self-pay | Admitting: Anesthesiology

## 2011-12-06 ENCOUNTER — Inpatient Hospital Stay (HOSPITAL_COMMUNITY): Payer: BC Managed Care – PPO | Admitting: Anesthesiology

## 2011-12-06 ENCOUNTER — Encounter (HOSPITAL_COMMUNITY): Payer: Self-pay | Admitting: *Deleted

## 2011-12-06 ENCOUNTER — Inpatient Hospital Stay (HOSPITAL_COMMUNITY): Payer: BC Managed Care – PPO

## 2011-12-06 LAB — CBC
HCT: 34.3 % — ABNORMAL LOW (ref 36.0–46.0)
MCH: 27.1 pg (ref 26.0–34.0)
MCHC: 31.8 g/dL (ref 30.0–36.0)
MCV: 85.3 fL (ref 78.0–100.0)
RDW: 14.4 % (ref 11.5–15.5)

## 2011-12-06 LAB — STREP B DNA PROBE

## 2011-12-06 MED ORDER — OXYTOCIN 20 UNITS IN LACTATED RINGERS INFUSION - SIMPLE: 125.0000 mL/h | Freq: Once | INTRAVENOUS | Status: DC

## 2011-12-06 MED ORDER — TERBUTALINE SULFATE 1 MG/ML IJ SOLN: 0.2500 mg | Freq: Once | INTRAMUSCULAR | Status: AC | PRN

## 2011-12-06 MED ORDER — PENICILLIN G POTASSIUM 5000000 UNITS IJ SOLR
5.0000 10*6.[IU] | Freq: Once | INTRAVENOUS | Status: AC
  Administered 2011-12-06: 5 10*6.[IU] via INTRAVENOUS
  Filled 2011-12-06: qty 5

## 2011-12-06 MED ORDER — FENTANYL 2.5 MCG/ML BUPIVACAINE 1/10 % EPIDURAL INFUSION (WH - ANES)
14.0000 mL/h | INTRAMUSCULAR | Status: DC
  Administered 2011-12-06 (×2): 14 mL/h via EPIDURAL
  Filled 2011-12-06 (×3): qty 60

## 2011-12-06 MED ORDER — ACETAMINOPHEN 325 MG PO TABS: 650.0000 mg | ORAL_TABLET | ORAL | Status: DC | PRN

## 2011-12-06 MED ORDER — PENICILLIN G POTASSIUM 5000000 UNITS IJ SOLR
2.5000 10*6.[IU] | INTRAVENOUS | Status: DC
  Administered 2011-12-06 (×5): 2.5 10*6.[IU] via INTRAVENOUS
  Filled 2011-12-06 (×9): qty 2.5

## 2011-12-06 MED ORDER — FENTANYL 2.5 MCG/ML BUPIVACAINE 1/10 % EPIDURAL INFUSION (WH - ANES)
INTRAMUSCULAR | Status: DC | PRN
  Administered 2011-12-06: 12 mL/h via EPIDURAL

## 2011-12-06 MED ORDER — PHENYLEPHRINE 40 MCG/ML (10ML) SYRINGE FOR IV PUSH (FOR BLOOD PRESSURE SUPPORT)
80.0000 ug | PREFILLED_SYRINGE | INTRAVENOUS | Status: DC | PRN
  Filled 2011-12-06: qty 5

## 2011-12-06 MED ORDER — OXYTOCIN 20 UNITS IN LACTATED RINGERS INFUSION - SIMPLE
1.0000 m[IU]/min | INTRAVENOUS | Status: DC
  Administered 2011-12-06: 2 m[IU]/min via INTRAVENOUS

## 2011-12-06 MED ORDER — LACTATED RINGERS IV SOLN
INTRAVENOUS | Status: DC
  Administered 2011-12-06 – 2011-12-07 (×3): via INTRAVENOUS

## 2011-12-06 MED ORDER — IBUPROFEN 600 MG PO TABS
600.0000 mg | ORAL_TABLET | Freq: Four times a day (QID) | ORAL | Status: DC | PRN
  Administered 2011-12-07: 600 mg via ORAL
  Filled 2011-12-06: qty 1

## 2011-12-06 MED ORDER — LACTATED RINGERS IV SOLN
500.0000 mL | INTRAVENOUS | Status: DC | PRN
  Administered 2011-12-06: 1000 mL via INTRAVENOUS

## 2011-12-06 MED ORDER — OXYCODONE-ACETAMINOPHEN 5-325 MG PO TABS: 1.0000 | ORAL_TABLET | ORAL | Status: DC | PRN

## 2011-12-06 MED ORDER — LIDOCAINE HCL (PF) 1 % IJ SOLN
INTRAMUSCULAR | Status: DC | PRN
  Administered 2011-12-06 (×2): 8 mL

## 2011-12-06 MED ORDER — OXYTOCIN BOLUS FROM INFUSION
500.0000 mL | Freq: Once | INTRAVENOUS | Status: AC
  Administered 2011-12-07: 500 mL via INTRAVENOUS
  Filled 2011-12-06: qty 500
  Filled 2011-12-06: qty 1000

## 2011-12-06 MED ORDER — LIDOCAINE HCL (PF) 1 % IJ SOLN
30.0000 mL | INTRAMUSCULAR | Status: DC | PRN
  Filled 2011-12-06: qty 30

## 2011-12-06 MED ORDER — EPHEDRINE 5 MG/ML INJ: 10.0000 mg | INTRAVENOUS | Status: DC | PRN

## 2011-12-06 MED ORDER — DIPHENHYDRAMINE HCL 50 MG/ML IJ SOLN: 12.5000 mg | INTRAMUSCULAR | Status: DC | PRN

## 2011-12-06 MED ORDER — FLEET ENEMA 7-19 GM/118ML RE ENEM: 1.0000 | ENEMA | RECTAL | Status: DC | PRN

## 2011-12-06 MED ORDER — CITRIC ACID-SODIUM CITRATE 334-500 MG/5ML PO SOLN: 30.0000 mL | ORAL | Status: DC | PRN

## 2011-12-06 MED ORDER — EPHEDRINE 5 MG/ML INJ
10.0000 mg | INTRAVENOUS | Status: DC | PRN
  Filled 2011-12-06: qty 4

## 2011-12-06 MED ORDER — PHENYLEPHRINE 40 MCG/ML (10ML) SYRINGE FOR IV PUSH (FOR BLOOD PRESSURE SUPPORT): 80.0000 ug | PREFILLED_SYRINGE | INTRAVENOUS | Status: DC | PRN

## 2011-12-06 MED ORDER — LACTATED RINGERS IV SOLN
500.0000 mL | Freq: Once | INTRAVENOUS | Status: AC
  Administered 2011-12-06: 500 mL via INTRAVENOUS

## 2011-12-06 MED ORDER — ONDANSETRON HCL 4 MG/2ML IJ SOLN
4.0000 mg | Freq: Four times a day (QID) | INTRAMUSCULAR | Status: DC | PRN
  Administered 2011-12-07: 4 mg via INTRAVENOUS
  Filled 2011-12-06: qty 2

## 2011-12-06 NOTE — MAU Note (Signed)
Pt states, " It started trickling at 2300 after I went to the bathroom and at first I thought it was just my bladder but it just keeps trickling and my pad is wet. I'm having a few contractions."

## 2011-12-06 NOTE — Progress Notes (Signed)
This note also relates to the following rows which could not be included: Pulse Rate - Cannot attach notes to unvalidated device data SpO2 - Cannot attach notes to unvalidated device data  12/06/11 2035  Provider Notification  Provider Name/Title Meriel Pica, MD  Method of Notification Phone  Request Evaluate - remote  Notification Reason Fetal heart rate change;Membrane status;Vaginal exam;Uterine activity   Dr. Marcelle Overlie notified of fhr with recurrent lates, interventions performed, UC pattern, pitocin dose and SVE. Order received to D/C pitocin and place IFSE.

## 2011-12-06 NOTE — Progress Notes (Signed)
[place] ordered due to Dr. Marcelle Overlie unable to feel fetal  presenting part. Will not start Pitocin at this time until presenting part varified.

## 2011-12-06 NOTE — Anesthesia Preprocedure Evaluation (Signed)
Anesthesia Evaluation  Patient identified by MRN, date of birth, ID band Patient awake    Reviewed: Allergy & Precautions, H&P , NPO status , Patient's Chart, lab work & pertinent test results  Airway Mallampati: II TM Distance: >3 FB Neck ROM: full    Dental No notable dental hx.    Pulmonary neg pulmonary ROS,          Cardiovascular negative cardio ROS      Neuro/Psych PSYCHIATRIC DISORDERS Anxiety Depression    GI/Hepatic negative GI ROS, Neg liver ROS,   Endo/Other  GestationalMorbid obesity  Renal/GU negative Renal ROS     Musculoskeletal negative musculoskeletal ROS (+)   Abdominal (+) + obese,   Peds  Hematology negative hematology ROS (+)   Anesthesia Other Findings   Reproductive/Obstetrics (+) Pregnancy                           Anesthesia Physical Anesthesia Plan  ASA: III  Anesthesia Plan: Epidural   Post-op Pain Management:    Induction:   Airway Management Planned:   Additional Equipment:   Intra-op Plan:   Post-operative Plan:   Informed Consent: I have reviewed the patients History and Physical, chart, labs and discussed the procedure including the risks, benefits and alternatives for the proposed anesthesia with the patient or authorized representative who has indicated his/her understanding and acceptance.     Plan Discussed with:   Anesthesia Plan Comments:         Anesthesia Quick Evaluation

## 2011-12-06 NOTE — Progress Notes (Signed)
vTX BY us>>AUG WITH PITOCIN PER PROTOCOL

## 2011-12-06 NOTE — Anesthesia Procedure Notes (Signed)
Epidural Patient location during procedure: OB Start time: 12/06/2011 1:29 PM End time: 12/06/2011 1:33 PM Reason for block: procedure for pain  Staffing Anesthesiologist: Sandrea Hughs Performed by: anesthesiologist   Preanesthetic Checklist Completed: patient identified, site marked, surgical consent, pre-op evaluation, timeout performed, IV checked, risks and benefits discussed and monitors and equipment checked  Epidural Patient position: sitting Prep: site prepped and draped and DuraPrep Patient monitoring: continuous pulse ox and blood pressure Approach: midline Injection technique: LOR air  Needle:  Needle type: Tuohy  Needle gauge: 17 G Needle length: 9 cm Needle insertion depth: 6 cm Catheter type: closed end flexible Catheter size: 19 Gauge Catheter at skin depth: 11 cm Test dose: negative  Assessment Sensory level: T8 Events: blood not aspirated, injection not painful, no injection resistance, negative IV test and no paresthesia

## 2011-12-06 NOTE — H&P (Signed)
Tracey Anderson is a 36 y.o. female presenting for SROM at [redacted]w[redacted]d . Maternal Medical History:  Reason for admission: Reason for admission: rupture of membranes.  Contractions: Onset was 3-5 hours ago.   Frequency: irregular.   Perceived severity is mild.    Fetal activity: Perceived fetal activity is normal.   Last perceived fetal movement was within the past hour.      OB History    Grav Para Term Preterm Abortions TAB SAB Ect Mult Living   2 1 1   0     1     Past Medical History  Diagnosis Date  . Arthritis   . Gestational diabetes   . Environmental allergies   . Hx gestational diabetes    Past Surgical History  Procedure Date  . Cholecystectomy 2009  . Cystectomy 2003    left breast   Family History: family history includes Cancer in her mother and sister; Diabetes in her maternal grandfather; Heart disease in her paternal grandfather; Hypertension in her maternal grandfather; and Stroke in her maternal grandfather. Social History:  reports that she has never smoked. She has never used smokeless tobacco. She reports that she does not drink alcohol or use illicit drugs.  ROS  Dilation: 1 Effacement (%): 50 Station: -3 Exam by:: M.Topp,RN Blood pressure 126/66, pulse 88, temperature 98.2 F (36.8 C), temperature source Oral, resp. rate 18, height 5\' 1"  (1.549 m), weight 216 lb (97.977 kg). Maternal Exam:  Uterine Assessment: Contraction strength is mild.  Abdomen: Patient reports no abdominal tenderness. Fundal height is 35cm.   Estimated fetal weight is AGA.   Fetal presentation: vertex  Introitus: Ferning test: positive.  Nitrazine test: positive. Amniotic fluid character: clear.  Pelvis: adequate for delivery.   Cervix: Cervix evaluated by digital exam.     Physical Exam  Constitutional: She is oriented to person, place, and time. She appears well-developed and well-nourished.  HENT:  Head: Normocephalic and atraumatic.  Neck: Normal range of motion. Neck  supple.  Cardiovascular: Normal rate and regular rhythm.   Respiratory: Effort normal and breath sounds normal.  GI:       35 cm FH, FHR 142  Genitourinary:       Clear AF in MAU, cx 2/50%  Musculoskeletal: Normal range of motion.  Neurological: She is alert and oriented to person, place, and time.    Prenatal labs: ABO, Rh: A/Positive/-- (09/25 0000) Antibody: Negative (09/25 0000) Rubella: Immune (09/25 0000) RPR: Nonreactive (09/25 0000)  HBsAg: Negative (09/25 0000)  HIV: Non-reactive (09/25 0000)  GBS: Unknown (03/30 0000)   Assessment/Plan: 36 + weeks, with SROM, unk GBS, hx diet controlled GDM>>start PCN protocol after GBS culture   Myasia Sinatra M 12/06/2011, 8:10 AM

## 2011-12-06 NOTE — Progress Notes (Signed)
After doing SVE there was no return of AF and could feel membranes. Cannot find documentation that pt was fern postive in triage. Spoke with [place] requested he allow me to do Amnisure on pt to confirm SROM for sure. Marcelle Overlie would not allow amnisure to be done. States "We are commited now she is on pitocin and has an epidural". Again made him aware pt is only 36.5weeks.

## 2011-12-06 NOTE — Progress Notes (Signed)
Vertex per ultrasound

## 2011-12-06 NOTE — Progress Notes (Signed)
Pit now @ 16 mu/min, cx 2/30% ISE used to AROM forebag and IUPC placed, stable FHR

## 2011-12-06 NOTE — Progress Notes (Signed)
Spoke with Briant Cedar, RN MAU nurse who cared for this pt last night states that pt was fern postive but she failed to put it in [facility].

## 2011-12-06 NOTE — Progress Notes (Signed)
cx post, 1-2, thk, NO presenting part>>will obtain bedside US

## 2011-12-06 NOTE — Progress Notes (Signed)
Called Dr. Marcelle Overlie to update on UC's irregular, need for augmentation.  MD states he will check pts cervix when he rounds in am.

## 2011-12-07 ENCOUNTER — Encounter (HOSPITAL_COMMUNITY): Payer: Self-pay | Admitting: *Deleted

## 2011-12-07 MED ORDER — BENZOCAINE-MENTHOL 20-0.5 % EX AERO
INHALATION_SPRAY | CUTANEOUS | Status: AC
  Filled 2011-12-07: qty 56

## 2011-12-07 MED ORDER — OXYCODONE-ACETAMINOPHEN 5-325 MG PO TABS: 1.0000 | ORAL_TABLET | Freq: Four times a day (QID) | ORAL | Status: DC | PRN

## 2011-12-07 MED ORDER — WITCH HAZEL-GLYCERIN EX PADS: 1.0000 "application " | MEDICATED_PAD | CUTANEOUS | Status: DC | PRN

## 2011-12-07 MED ORDER — BENZOCAINE-MENTHOL 20-0.5 % EX AERO
1.0000 "application " | INHALATION_SPRAY | CUTANEOUS | Status: DC | PRN
  Administered 2011-12-07: 1 via TOPICAL
  Filled 2011-12-07: qty 56

## 2011-12-07 MED ORDER — IBUPROFEN 800 MG PO TABS
800.0000 mg | ORAL_TABLET | Freq: Three times a day (TID) | ORAL | Status: DC | PRN
  Administered 2011-12-07: 800 mg via ORAL
  Filled 2011-12-07: qty 1

## 2011-12-07 MED ORDER — SIMETHICONE 80 MG PO CHEW: 80.0000 mg | CHEWABLE_TABLET | ORAL | Status: DC | PRN

## 2011-12-07 MED ORDER — SENNOSIDES-DOCUSATE SODIUM 8.6-50 MG PO TABS
2.0000 | ORAL_TABLET | Freq: Every day | ORAL | Status: DC
  Administered 2011-12-07: 2 via ORAL

## 2011-12-07 MED ORDER — ZOLPIDEM TARTRATE 5 MG PO TABS: 5.0000 mg | ORAL_TABLET | Freq: Every evening | ORAL | Status: DC | PRN

## 2011-12-07 MED ORDER — PRENATAL MULTIVITAMIN CH
1.0000 | ORAL_TABLET | Freq: Every day | ORAL | Status: DC
  Administered 2011-12-07 – 2011-12-08 (×2): 1 via ORAL
  Filled 2011-12-07 (×2): qty 1

## 2011-12-07 MED ORDER — DIPHENHYDRAMINE HCL 25 MG PO CAPS: 25.0000 mg | ORAL_CAPSULE | Freq: Four times a day (QID) | ORAL | Status: DC | PRN

## 2011-12-07 MED ORDER — BISACODYL 10 MG RE SUPP
10.0000 mg | Freq: Every day | RECTAL | Status: DC | PRN
  Filled 2011-12-07: qty 1

## 2011-12-07 MED ORDER — MEASLES, MUMPS & RUBELLA VAC ~~LOC~~ INJ
0.5000 mL | INJECTION | Freq: Once | SUBCUTANEOUS | Status: DC
  Filled 2011-12-07: qty 0.5

## 2011-12-07 MED ORDER — ONDANSETRON HCL 4 MG PO TABS: 4.0000 mg | ORAL_TABLET | ORAL | Status: DC | PRN

## 2011-12-07 MED ORDER — LANOLIN HYDROUS EX OINT: TOPICAL_OINTMENT | CUTANEOUS | Status: DC | PRN

## 2011-12-07 MED ORDER — TETANUS-DIPHTH-ACELL PERTUSSIS 5-2.5-18.5 LF-MCG/0.5 IM SUSP
0.5000 mL | Freq: Once | INTRAMUSCULAR | Status: DC
Start: 2011-12-08 — End: 2011-12-08

## 2011-12-07 MED ORDER — DIBUCAINE 1 % RE OINT
1.0000 "application " | TOPICAL_OINTMENT | RECTAL | Status: DC | PRN
  Filled 2011-12-07: qty 28

## 2011-12-07 MED ORDER — ONDANSETRON HCL 4 MG/2ML IJ SOLN: 4.0000 mg | INTRAMUSCULAR | Status: DC | PRN

## 2011-12-07 MED ORDER — FLEET ENEMA 7-19 GM/118ML RE ENEM: 1.0000 | ENEMA | Freq: Every day | RECTAL | Status: DC | PRN

## 2011-12-07 NOTE — Anesthesia Postprocedure Evaluation (Signed)
Anesthesia Post Note  Patient: Tracey Anderson  Procedure(s) Performed: * No procedures listed *  Anesthesia type: Epidural  Patient location: Mother/Baby  Post pain: Pain level controlled  Post assessment: Post-op Vital signs reviewed  Last Vitals:  Filed Vitals:   12/07/11 0529  BP: 114/69  Pulse: 73  Temp: 37 C  Resp: 18    Post vital signs: Reviewed  Level of consciousness: awake  Complications: No apparent anesthesia complications 

## 2011-12-07 NOTE — Anesthesia Postprocedure Evaluation (Signed)
Anesthesia Post Note  Patient: Tracey Anderson  Procedure(s) Performed: * No procedures listed *  Anesthesia type: Epidural  Patient location: Mother/Baby  Post pain: Pain level controlled  Post assessment: Post-op Vital signs reviewed  Last Vitals:  Filed Vitals:   12/07/11 0529  BP: 114/69  Pulse: 73  Temp: 37 C  Resp: 18    Post vital signs: Reviewed  Level of consciousness: awake  Complications: No apparent anesthesia complications

## 2011-12-07 NOTE — Progress Notes (Signed)
Post Partum Day 0 Subjective: no complaints  Objective: Blood pressure 114/69, pulse 73, temperature 98.6 F (37 C), temperature source Oral, resp. rate 18, height 5\' 1"  (1.549 m), weight 216 lb (97.977 kg), SpO2 81.00%, unknown if currently breastfeeding.  Physical Exam:  General: alert Lochia: appropriate Uterine Fundus: firm Incision: healing well DVT Evaluation: No evidence of DVT seen on physical exam.   Basename 12/06/11 0220  HGB 10.9*  HCT 34.3*    Assessment/Plan: Plan for discharge tomorrow   LOS: 2 days   Tracey Anderson M 12/07/2011, 8:58 AM

## 2011-12-07 NOTE — Addendum Note (Signed)
Addendum  created 12/07/11 0915 by Jhonnie Garner, CRNA   Modules edited:Charges VN, Notes Section

## 2011-12-08 LAB — CBC
HCT: 33.7 % — ABNORMAL LOW (ref 36.0–46.0)
Hemoglobin: 10.7 g/dL — ABNORMAL LOW (ref 12.0–15.0)
MCH: 27.3 pg (ref 26.0–34.0)
MCV: 86 fL (ref 78.0–100.0)
Platelets: 193 10*3/uL (ref 150–400)
RBC: 3.92 MIL/uL (ref 3.87–5.11)
WBC: 12.8 10*3/uL — ABNORMAL HIGH (ref 4.0–10.5)

## 2011-12-08 LAB — CULTURE, BETA STREP (GROUP B ONLY)

## 2011-12-08 MED ORDER — IBUPROFEN 800 MG PO TABS: 800.0000 mg | ORAL_TABLET | Freq: Three times a day (TID) | ORAL | Status: AC | PRN

## 2011-12-08 NOTE — Discharge Summary (Signed)
Obstetric Discharge Summary Reason for Admission: rupture of membranes Prenatal Procedures: ultrasound Intrapartum Procedures: spontaneous vaginal delivery Postpartum Procedures: none Complications-Operative and Postpartum: 1 degree perineal laceration Hemoglobin  Date Value Range Status  12/08/2011 10.7* 12.0-15.0 (g/dL) Final     HCT  Date Value Range Status  12/08/2011 33.7* 36.0-46.0 (%) Final    Physical Exam:  General: alert and cooperative Lochia: appropriate Uterine Fundus: firm Incision: perineum intact DVT Evaluation: No evidence of DVT seen on physical exam.  Discharge Diagnoses: s/p vag delivery at 36 5/7 weeks  Discharge Information: Date: 12/08/2011 Activity: pelvic rest Diet: routine Medications: PNV and Ibuprofen Condition: stable Instructions: refer to practice specific booklet Discharge to: home   Newborn Data: Live born female  Birth Weight: 7 lb 6.7 oz (3365 g) APGAR: 4, 9  Home with mother.  Jedaiah Rathbun G 12/08/2011, 8:25 AM

## 2011-12-08 NOTE — Progress Notes (Signed)
Post Partum Day 1 Subjective: no complaints, up ad lib, voiding and tolerating PO  Objective: Blood pressure 122/76, pulse 76, temperature 98 F (36.7 C), temperature source Oral, resp. rate 18, height 5\' 1"  (1.549 m), weight 97.977 kg (216 lb), SpO2 81.00%, unknown if currently breastfeeding.  Physical Exam:  General: alert and cooperative Lochia: appropriate Uterine Fundus: firm Incision: perineum intact DVT Evaluation: No evidence of DVT seen on physical exam.   Basename 12/08/11 0530 12/06/11 0220  HGB 10.7* 10.9*  HCT 33.7* 34.3*    Assessment/Plan: Discharge home Check 2 hr pp blood sugar prior to discharge   LOS: 3 days   Tracey Anderson G 12/08/2011, 8:15 AM

## 2011-12-09 NOTE — Progress Notes (Signed)
Post discharge chart review completed.  

## 2011-12-24 ENCOUNTER — Inpatient Hospital Stay (HOSPITAL_COMMUNITY): Admission: RE | Admit: 2011-12-24 | Payer: BC Managed Care – PPO | Source: Ambulatory Visit

## 2012-07-09 ENCOUNTER — Other Ambulatory Visit (HOSPITAL_COMMUNITY): Payer: Self-pay | Admitting: Obstetrics and Gynecology

## 2012-07-09 DIAGNOSIS — Z09 Encounter for follow-up examination after completed treatment for conditions other than malignant neoplasm: Secondary | ICD-10-CM

## 2012-07-09 DIAGNOSIS — Z9851 Tubal ligation status: Secondary | ICD-10-CM

## 2012-07-19 ENCOUNTER — Ambulatory Visit (HOSPITAL_COMMUNITY): Payer: BC Managed Care – PPO

## 2012-08-16 ENCOUNTER — Other Ambulatory Visit (HOSPITAL_COMMUNITY): Payer: Self-pay | Admitting: Obstetrics and Gynecology

## 2012-08-16 DIAGNOSIS — Z09 Encounter for follow-up examination after completed treatment for conditions other than malignant neoplasm: Secondary | ICD-10-CM

## 2012-08-16 DIAGNOSIS — Z9851 Tubal ligation status: Secondary | ICD-10-CM

## 2012-08-30 ENCOUNTER — Ambulatory Visit (HOSPITAL_COMMUNITY)
Admission: RE | Admit: 2012-08-30 | Discharge: 2012-08-30 | Disposition: A | Discharge status: Home / Self Care | Payer: BC Managed Care – PPO | Source: Ambulatory Visit | Attending: Obstetrics and Gynecology | Admitting: Obstetrics and Gynecology

## 2012-08-30 DIAGNOSIS — Z09 Encounter for follow-up examination after completed treatment for conditions other than malignant neoplasm: Secondary | ICD-10-CM

## 2012-08-30 DIAGNOSIS — Z3049 Encounter for surveillance of other contraceptives: Secondary | ICD-10-CM | POA: Insufficient documentation

## 2012-08-30 DIAGNOSIS — Z9851 Tubal ligation status: Secondary | ICD-10-CM

## 2012-08-30 MED ORDER — IOHEXOL 300 MG/ML  SOLN: 8.0000 mL | Freq: Once | INTRAMUSCULAR | Status: AC | PRN

## 2013-11-22 ENCOUNTER — Encounter: Payer: BC Managed Care – PPO | Admitting: Family Medicine

## 2013-11-22 ENCOUNTER — Telehealth: Payer: Self-pay | Admitting: Family Medicine

## 2013-11-22 NOTE — Telephone Encounter (Signed)
Pt was last seen 2011. Pt would like to re-est with md. Pt hus sees doc burchette. Can I sch?

## 2013-11-22 NOTE — Progress Notes (Signed)
Error   This encounter was created in error - please disregard. 

## 2013-11-22 NOTE — Telephone Encounter (Signed)
yes

## 2013-11-23 NOTE — Telephone Encounter (Signed)
lmom for pt to callback to sch °

## 2013-11-29 NOTE — Telephone Encounter (Signed)
lmom for pt to callback to sch appt

## 2013-11-30 NOTE — Telephone Encounter (Signed)
Pt has been sch for 12-14-13

## 2013-12-01 ENCOUNTER — Ambulatory Visit: Payer: BC Managed Care – PPO | Admitting: Family Medicine

## 2013-12-14 ENCOUNTER — Ambulatory Visit: Payer: BC Managed Care – PPO | Admitting: Family Medicine

## 2014-07-10 ENCOUNTER — Encounter (HOSPITAL_COMMUNITY): Payer: Self-pay | Admitting: *Deleted

## 2015-02-15 ENCOUNTER — Other Ambulatory Visit: Payer: Self-pay | Admitting: Obstetrics and Gynecology

## 2015-02-16 LAB — CYTOLOGY - PAP

## 2015-08-15 ENCOUNTER — Encounter: Payer: Self-pay | Admitting: Family Medicine

## 2015-08-15 ENCOUNTER — Ambulatory Visit (INDEPENDENT_AMBULATORY_CARE_PROVIDER_SITE_OTHER): Payer: BLUE CROSS/BLUE SHIELD | Admitting: Family Medicine

## 2015-08-15 VITALS — BP 124/80 | HR 99 | Temp 98.1°F | Resp 16 | Ht 61.0 in | Wt 219.2 lb

## 2015-08-15 DIAGNOSIS — R5382 Chronic fatigue, unspecified: Secondary | ICD-10-CM | POA: Diagnosis not present

## 2015-08-15 DIAGNOSIS — R739 Hyperglycemia, unspecified: Secondary | ICD-10-CM | POA: Diagnosis not present

## 2015-08-15 DIAGNOSIS — M7712 Lateral epicondylitis, left elbow: Secondary | ICD-10-CM

## 2015-08-15 DIAGNOSIS — J309 Allergic rhinitis, unspecified: Secondary | ICD-10-CM

## 2015-08-15 DIAGNOSIS — J3089 Other allergic rhinitis: Secondary | ICD-10-CM

## 2015-08-15 DIAGNOSIS — E669 Obesity, unspecified: Secondary | ICD-10-CM

## 2015-08-15 LAB — HEPATIC FUNCTION PANEL
ALT: 33 U/L (ref 0–35)
AST: 22 U/L (ref 0–37)
Albumin: 4.3 g/dL (ref 3.5–5.2)
Alkaline Phosphatase: 58 U/L (ref 39–117)
BILIRUBIN TOTAL: 0.6 mg/dL (ref 0.2–1.2)
Bilirubin, Direct: 0.1 mg/dL (ref 0.0–0.3)
Total Protein: 7.4 g/dL (ref 6.0–8.3)

## 2015-08-15 LAB — LIPID PANEL
CHOL/HDL RATIO: 5
Cholesterol: 186 mg/dL (ref 0–200)
HDL: 36.4 mg/dL — ABNORMAL LOW (ref >39.00)
NONHDL: 149.31
Triglycerides: 207 mg/dL — ABNORMAL HIGH (ref 0.0–149.0)
VLDL: 41.4 mg/dL — ABNORMAL HIGH (ref 0.0–40.0)

## 2015-08-15 LAB — BASIC METABOLIC PANEL
BUN: 11 mg/dL (ref 6–23)
CHLORIDE: 100 meq/L (ref 96–112)
CO2: 27 meq/L (ref 19–32)
CREATININE: 0.58 mg/dL (ref 0.40–1.20)
Calcium: 9.1 mg/dL (ref 8.4–10.5)
GFR: 124.14 mL/min (ref >60.00)
GLUCOSE: 121 mg/dL — AB (ref 70–99)
Potassium: 4.2 mEq/L (ref 3.5–5.1)
Sodium: 136 mEq/L (ref 135–145)

## 2015-08-15 LAB — LDL CHOLESTEROL, DIRECT: LDL DIRECT: 131 mg/dL

## 2015-08-15 LAB — HEMOGLOBIN A1C: Hgb A1c MFr Bld: 7.8 % — ABNORMAL HIGH (ref 4.6–6.5)

## 2015-08-15 LAB — TSH: TSH: 1.18 u[IU]/mL (ref 0.35–4.50)

## 2015-08-15 NOTE — Progress Notes (Signed)
   Subjective:    Patient ID: Tracey Anderson, female    DOB: [birth year], 39 y.o.   MRN: [id number]  HPI Patient seen to reestablish care. She's been seen here previously but over 3 years ago. She has history of obesity, past history of depression, perennial allergic rhinitis followed by allergist. She is followed by gynecologist regularly. She has frequent dry cough. She takes Singulair for allergies and recently was placed on meloxicam for some right lateral epicondylitis. No recent reflux symptoms. No history of asthma. Nonsmoker.  Her husband has type 2 diabetes. She recently checked her blood sugar obtained couple of readings between 150 and 160 fasting. She has some increased thirst but no weight loss and no urine frequency. She does recall being told she had gestational diabetes. Neither of her parents nor any other family members have type 2 diabetes history. She does have some nonspecific malaise. Poor sleep quality.  Past Medical History  Diagnosis Date  . Arthritis   . Gestational diabetes   . Environmental allergies   . Hx gestational diabetes    Past Surgical History  Procedure Laterality Date  . Cholecystectomy  2009  . Cystectomy  2003    left breast    reports that she has never smoked. She has never used smokeless tobacco. She reports that she does not drink alcohol or use illicit drugs. family history includes Diabetes in her maternal grandfather; Heart disease in her paternal grandfather; Hypertension in her maternal grandfather; Stroke in her maternal grandfather. No Known Allergies    Review of Systems  Constitutional: Positive for fatigue. Negative for appetite change and unexpected weight change.  HENT: Positive for congestion. Negative for trouble swallowing.   Eyes: Negative for visual disturbance.  Respiratory: Positive for cough. Negative for chest tightness, shortness of breath and wheezing.   Cardiovascular: Negative for chest pain, palpitations and leg  swelling.  Gastrointestinal: Negative for abdominal pain.  Endocrine: Positive for polydipsia. Negative for polyuria.  Genitourinary: Negative for dysuria.  Neurological: Negative for dizziness, seizures, syncope, weakness, light-headedness and headaches.  Psychiatric/Behavioral: Negative for sleep disturbance.       Objective:   Physical Exam  Constitutional: She appears well-developed and well-nourished.  Eyes: Pupils are equal, round, and reactive to light.  Neck: Neck supple. No JVD present. No thyromegaly present.  Cardiovascular: Normal rate and regular rhythm.  Exam reveals no gallop.   Pulmonary/Chest: Effort normal and breath sounds normal. No respiratory distress. She has no wheezes. She has no rales.  Musculoskeletal: She exhibits no edema.  Lymphadenopathy:    She has no cervical adenopathy.  Neurological: She is alert.  Psychiatric: She has a normal mood and affect. Her behavior is normal.          Assessment & Plan:  #1 concerns over recent hyperglycemia. Fasting blood sugars per patient 150-160. Concern for new onset type 2 diabetes. Prior history of gestational diabetes. Check glucose along with A1c. Consider metformin if elevated. Needs to lose weight #2 history of perennial allergic rhinitis #3 obesity. We discussed importance of dietary modification and more consistent exercise.  Discussed strategies for weight loss.

## 2015-08-16 MED ORDER — METFORMIN HCL 500 MG PO TABS: 500.0000 mg | ORAL_TABLET | Freq: Two times a day (BID) | ORAL | Status: DC

## 2015-08-16 NOTE — Addendum Note (Signed)
Addended by: Tempie HoistMCNEIL, AUTUMN M on: 08/16/2015 12:15 PM   Modules accepted: Orders

## 2015-08-21 ENCOUNTER — Other Ambulatory Visit: Payer: Self-pay | Admitting: Occupational Medicine

## 2015-08-21 ENCOUNTER — Ambulatory Visit: Payer: Self-pay

## 2015-08-21 DIAGNOSIS — M25572 Pain in left ankle and joints of left foot: Secondary | ICD-10-CM

## 2015-09-21 ENCOUNTER — Encounter: Payer: Self-pay | Admitting: Family Medicine

## 2015-09-21 ENCOUNTER — Ambulatory Visit (INDEPENDENT_AMBULATORY_CARE_PROVIDER_SITE_OTHER): Payer: BLUE CROSS/BLUE SHIELD | Admitting: Family Medicine

## 2015-09-21 VITALS — BP 120/82 | HR 102 | Temp 98.3°F | Ht 61.0 in | Wt 222.0 lb

## 2015-09-21 DIAGNOSIS — E1165 Type 2 diabetes mellitus with hyperglycemia: Secondary | ICD-10-CM | POA: Insufficient documentation

## 2015-09-21 DIAGNOSIS — E119 Type 2 diabetes mellitus without complications: Secondary | ICD-10-CM | POA: Diagnosis not present

## 2015-09-21 DIAGNOSIS — E669 Obesity, unspecified: Secondary | ICD-10-CM | POA: Diagnosis not present

## 2015-09-21 NOTE — Patient Instructions (Signed)

## 2015-09-21 NOTE — Progress Notes (Signed)
Pre visit review using our clinic review tool, if applicable. No additional management support is needed unless otherwise documented below in the visit note. 

## 2015-09-21 NOTE — Progress Notes (Signed)
   Subjective:    Patient ID: Tracey Anderson, female    DOB: Aug 15, 1976, 40 y.o.   MRN: [id number]  HPI  follow-up type 2 diabetes. Recently diagnosed. We initiated metformin 500 mg twice a day. Hemoglobin A1c 7.8%. Denies any symptoms of polyuria or polydipsia. Tolerating medication well. Fasting blood sugars had been around 160 and currently 125. She walks occasionally for exercise but not regularly. She has gained 3 pounds since last visit. She had some dyslipidemia but lipids were relatively stable.  Past Medical History  Diagnosis Date  . Arthritis   . Gestational diabetes   . Environmental allergies   . Hx gestational diabetes    Past Surgical History  Procedure Laterality Date  . Cholecystectomy  2009  . Cystectomy  2003    left breast    reports that she has never smoked. She has never used smokeless tobacco. She reports that she does not drink alcohol or use illicit drugs. family history includes Diabetes in her maternal grandfather; Heart disease in her paternal grandfather; Hypertension in her maternal grandfather; Stroke in her maternal grandfather. No Known Allergies    Review of Systems  Constitutional: Negative for fatigue.  Eyes: Negative for visual disturbance.  Respiratory: Negative for cough, chest tightness, shortness of breath and wheezing.   Cardiovascular: Negative for chest pain, palpitations and leg swelling.  Endocrine: Negative for polydipsia and polyuria.  Neurological: Negative for dizziness, seizures, syncope, weakness, light-headedness and headaches.       Objective:   Physical Exam  Constitutional: She appears well-developed and well-nourished.  Eyes: Pupils are equal, round, and reactive to light.  Neck: Neck supple. No JVD present. No thyromegaly present.  Cardiovascular: Normal rate and regular rhythm.  Exam reveals no gallop.   Pulmonary/Chest: Effort normal and breath sounds normal. No respiratory distress. She has no wheezes. She has no  rales.  Musculoskeletal: She exhibits no edema.  Neurological: She is alert.          Assessment & Plan:   New-onset type 2 diabetes. Recent initiation of metformin. Fasting blood sugars have improved. She is strongly encouraged to lose weight. Offered diabetes educator and she is not interested at this time. Reassess in 3 months and repeat A1c then.

## 2015-11-27 ENCOUNTER — Ambulatory Visit (INDEPENDENT_AMBULATORY_CARE_PROVIDER_SITE_OTHER): Payer: BLUE CROSS/BLUE SHIELD | Admitting: Family Medicine

## 2015-11-27 VITALS — BP 132/90 | HR 108 | Temp 98.7°F | Ht 61.0 in | Wt 218.0 lb

## 2015-11-27 DIAGNOSIS — R5383 Other fatigue: Secondary | ICD-10-CM

## 2015-11-27 DIAGNOSIS — R0683 Snoring: Secondary | ICD-10-CM | POA: Diagnosis not present

## 2015-11-27 DIAGNOSIS — G4710 Hypersomnia, unspecified: Secondary | ICD-10-CM

## 2015-11-27 DIAGNOSIS — G44209 Tension-type headache, unspecified, not intractable: Secondary | ICD-10-CM

## 2015-11-27 DIAGNOSIS — R400 Somnolence: Secondary | ICD-10-CM

## 2015-11-27 MED ORDER — METFORMIN HCL 500 MG PO TABS: 500.0000 mg | ORAL_TABLET | Freq: Two times a day (BID) | ORAL | Status: DC

## 2015-11-27 NOTE — Progress Notes (Signed)
Pre visit review using our clinic review tool, if applicable. No additional management support is needed unless otherwise documented below in the visit note. 

## 2015-11-27 NOTE — Progress Notes (Signed)
   Subjective:    Patient ID: Tracey Anderson, female    DOB: 08/06/1976, 40 y.o.   MRN: [id number]  HPI   Acute   Patient seen today with a complaint of headache times 7 days.  Medical history includes Type 2 Diabetes, Anxiety, and obesity. She reports having headaches sporadically over the course of last three months.   Headache Last week, headaches occurred daily. She describes headache as bilateral, frontal, with pain ranging from intermittent pounding to aching. She has taken ibuprofen and acetaminophen with minimal relief of symptoms. No sinusitis symptoms.  No hx of migraines.  No light sensitivity. No exacerbating factors.    Fatigue Patient reports experiencing worsening episodes of being tired. She reports feeling that nighttime sleep is unrefreshing. Reports recent daytime sleepiness that occurs with driving and while at work. Her husband has complained about her increase in snoring and at times her snoring has awakened her from sleep.  Denies trouble falling asleep. No known observed apnea episodes.   Recently diagnosed Type 2 diabetes and is on Metformin.  Has lost a few pounds.  Past Medical History  Diagnosis Date  . Arthritis   . Gestational diabetes   . Environmental allergies   . Hx gestational diabetes    Past Surgical History  Procedure Laterality Date  . Cholecystectomy  2009  . Cystectomy  2003    left breast    reports that she has never smoked. She has never used smokeless tobacco. She reports that she does not drink alcohol or use illicit drugs. family history includes Diabetes in her maternal grandfather; Heart disease in her paternal grandfather; Hypertension in her maternal grandfather; Stroke in her maternal grandfather. No Known Allergies     Review of Systems  Constitutional: Positive for fatigue.  Eyes: Negative.   Respiratory: Negative.   Cardiovascular: Negative.   Gastrointestinal: Negative.   Endocrine: Negative.   Genitourinary:  Negative.   Musculoskeletal: Negative.   Skin: Negative.   Allergic/Immunologic: Negative.   Neurological: Positive for headaches.  Hematological: Negative.   Psychiatric/Behavioral: Negative.          Objective:   Physical Exam  Constitutional: She is oriented to person, place, and time. She appears well-developed and well-nourished.  HENT:  Head: Normocephalic and atraumatic.  Right Ear: External ear normal.  Left Ear: External ear normal.  Eyes: Conjunctivae and EOM are normal. Pupils are equal, round, and reactive to light.  Neck: Normal range of motion.  Cardiovascular: Normal rate and regular rhythm.   Pulmonary/Chest: Effort normal and breath sounds normal.  Musculoskeletal: Normal range of motion.  Neurological: She is alert and oriented to person, place, and time. No cranial nerve deficit. Coordination normal.  Skin: Skin is warm and dry.  Psychiatric: She has a normal mood and affect. Her behavior is normal. Judgment and thought content normal.          Assessment & Plan:   Headaches- suspect acute tension type.  Plan: Take 600 mg Naproxen as needed twice daily at the onset of headache. Avoid regular/daily use of analgesics.    Follow-up as needed.  Fatigue- Administered Epworth Sleepiness scale and patient scored 15/24 indicating moderate daytime sleepiness.  In addition the patient is experiencing fatigue, unrefreshing sleep, and she is obese, diagnostics are needed to rule out sleep apnea.   Plan: Referral for a  Split Sleep study

## 2015-11-27 NOTE — Patient Instructions (Signed)
Tension Headache A tension headache is a feeling of pain, pressure, or aching that is often felt over the front and sides of the head. The pain can be dull, or it can feel tight (constricting). Tension headaches are not normally associated with nausea or vomiting, and they do not get worse with physical activity. Tension headaches can last from 30 minutes to several days. This is the most common type of headache. CAUSES The exact cause of this condition is not known. Tension headaches often begin after stress, anxiety, or depression. Other triggers may include:  Alcohol.  Too much caffeine, or caffeine withdrawal.  Respiratory infections, such as colds, flu, or sinus infections.  Dental problems or teeth clenching.  Fatigue.  Holding your head and neck in the same position for a long period of time, such as while using a computer.  Smoking. SYMPTOMS Symptoms of this condition include:  A feeling of pressure around the head.  Dull, aching head pain.  Pain felt over the front and sides of the head.  Tenderness in the muscles of the head, neck, and shoulders. DIAGNOSIS This condition may be diagnosed based on your symptoms and a physical exam. Tests may be done, such as a CT scan or an MRI of your head. These tests may be done if your symptoms are severe or unusual. TREATMENT This condition may be treated with lifestyle changes and medicines to help relieve symptoms. HOME CARE INSTRUCTIONS Managing Pain  Take over-the-counter and prescription medicines only as told by your health care provider.  Lie down in a dark, quiet room when you have a headache.  If directed, apply ice to the head and neck area:  Put ice in a plastic bag.  Place a towel between your skin and the bag.  Leave the ice on for 20 minutes, 2-3 times per day.  Use a heating pad or a hot shower to apply heat to the head and neck area as told by your health care provider. Eating and Drinking  Eat meals on  a regular schedule.  Limit alcohol use.  Decrease your caffeine intake, or stop using caffeine. General Instructions  Keep all follow-up visits as told by your health care provider. This is important.  Keep a headache journal to help find out what may trigger your headaches. For example, write down:  What you eat and drink.  How much sleep you get.  Any change to your diet or medicines.  Try massage or other relaxation techniques.  Limit stress.  Sit up straight, and avoid tensing your muscles.  Do not use tobacco products, including cigarettes, chewing tobacco, or e-cigarettes. If you need help quitting, ask your health care provider.  Exercise regularly as told by your health care provider.  Get 7-9 hours of sleep, or the amount recommended by your health care provider. SEEK MEDICAL CARE IF:  Your symptoms are not helped by medicine.  You have a headache that is different from what you normally experience.  You have nausea or you vomit.  You have a fever. SEEK IMMEDIATE MEDICAL CARE IF:  Your headache becomes severe.  You have repeated vomiting.  You have a stiff neck.  You have a loss of vision.  You have problems with speech.  You have pain in your eye or ear.  You have muscular weakness or loss of muscle control.  You lose your balance or you have trouble walking.  You feel faint or you pass out.  You have confusion.     This information is not intended to replace advice given to you by your health care provider. Make sure you discuss any questions you have with your health care provider.   Document Released: 08/25/2005 Document Revised: 05/16/2015 Document Reviewed: 12/18/2014 Elsevier Interactive Patient Education 2016 [employer]  

## 2015-12-18 ENCOUNTER — Ambulatory Visit (INDEPENDENT_AMBULATORY_CARE_PROVIDER_SITE_OTHER): Payer: BLUE CROSS/BLUE SHIELD | Admitting: Family Medicine

## 2015-12-18 DIAGNOSIS — R400 Somnolence: Secondary | ICD-10-CM

## 2015-12-18 DIAGNOSIS — G4710 Hypersomnia, unspecified: Secondary | ICD-10-CM

## 2015-12-18 DIAGNOSIS — E669 Obesity, unspecified: Secondary | ICD-10-CM | POA: Diagnosis not present

## 2015-12-18 DIAGNOSIS — E1165 Type 2 diabetes mellitus with hyperglycemia: Secondary | ICD-10-CM | POA: Diagnosis not present

## 2015-12-18 DIAGNOSIS — IMO0001 Reserved for inherently not codable concepts without codable children: Secondary | ICD-10-CM

## 2015-12-18 LAB — POCT GLYCOSYLATED HEMOGLOBIN (HGB A1C): HEMOGLOBIN A1C: 7.8

## 2015-12-18 MED ORDER — METFORMIN HCL 500 MG PO TABS: ORAL_TABLET | ORAL | Status: DC

## 2015-12-18 NOTE — Progress Notes (Signed)
Pre visit review using our clinic review tool, if applicable. No additional management support is needed unless otherwise documented below in the visit note. 
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
## 2015-12-18 NOTE — Patient Instructions (Addendum)
Take Metformin two tablets twice daily.                                  Diabetes and Exercise Exercising regularly is important. It is not just about losing weight. It has many health benefits, such as:  Improving your overall fitness, flexibility, and endurance.  Increasing your bone density.  Helping with weight control.  Decreasing your body fat.  Increasing your muscle strength.  Reducing stress and tension.  Improving your overall health. People with diabetes who exercise gain additional benefits because exercise:  Reduces appetite.  Improves the body's use of blood sugar (glucose).  Helps lower or control blood glucose.  Decreases blood pressure.  Helps control blood lipids (such as cholesterol and triglycerides).  Improves the body's use of the hormone insulin by:  Increasing the body's insulin sensitivity.  Reducing the body's insulin needs.  Decreases the risk for heart disease because exercising:  Lowers cholesterol and triglycerides levels.  Increases the levels of good cholesterol (such as high-density lipoproteins [HDL]) in the body.  Lowers blood glucose levels. YOUR ACTIVITY PLAN  Choose an activity that you enjoy, and set realistic goals. To exercise safely, you should begin practicing any new physical activity slowly, and gradually increase the intensity of the exercise over time. Your health care provider or diabetes educator can help create an activity plan that works for you. General recommendations include:  Encouraging children to engage in at least 60 minutes of physical activity each day.  Stretching and performing strength training exercises, such as yoga or weight lifting, at least 2 times per week.  Performing a total of at least 150 minutes of moderate-intensity exercise each week, such as brisk walking or water aerobics.  Exercising at least 3 days per week, making sure you allow no more than 2 consecutive days to pass without  exercising.  Avoiding long periods of inactivity (90 minutes or more). When you have to spend an extended period of time sitting down, take frequent breaks to walk or stretch. RECOMMENDATIONS FOR EXERCISING WITH TYPE 1 OR TYPE 2 DIABETES   Check your blood glucose before exercising. If blood glucose levels are greater than 240 mg/dL, check for urine ketones. Do not exercise if ketones are present.  Avoid injecting insulin into areas of the body that are going to be exercised. For example, avoid injecting insulin into:  The arms when playing tennis.  The legs when jogging.  Keep a record of:  Food intake before and after you exercise.  Expected peak times of insulin action.  Blood glucose levels before and after you exercise.  The type and amount of exercise you have done.  Review your records with your health care provider. Your health care provider will help you to develop guidelines for adjusting food intake and insulin amounts before and after exercising.  If you take insulin or oral hypoglycemic agents, watch for signs and symptoms of hypoglycemia. They include:  Dizziness.  Shaking.  Sweating.  Chills.  Confusion.  Drink plenty of water while you exercise to prevent dehydration or heat stroke. Body water is lost during exercise and must be replaced.  Talk to your health care provider before starting an exercise program to make sure it is safe for you. Remember, almost any type of activity is better than none.   This information is not intended to replace advice given to you by your health care provider.  Make sure you discuss any questions you have with your health care provider.   Document Released: 11/15/2003 Document Revised: 01/09/2015 Document Reviewed: 02/01/2013 Elsevier Interactive Patient Education [employer].

## 2016-01-11 ENCOUNTER — Emergency Department (HOSPITAL_COMMUNITY)
Admission: EM | Admit: 2016-01-11 | Discharge: 2016-01-11 | Disposition: A | Discharge status: Home / Self Care | Payer: BLUE CROSS/BLUE SHIELD | Attending: Emergency Medicine | Admitting: Emergency Medicine

## 2016-01-11 ENCOUNTER — Emergency Department (HOSPITAL_COMMUNITY): Payer: BLUE CROSS/BLUE SHIELD

## 2016-01-11 ENCOUNTER — Telehealth: Payer: Self-pay | Admitting: Family Medicine

## 2016-01-11 ENCOUNTER — Encounter (HOSPITAL_COMMUNITY): Payer: Self-pay | Admitting: *Deleted

## 2016-01-11 DIAGNOSIS — Z8632 Personal history of gestational diabetes: Secondary | ICD-10-CM | POA: Insufficient documentation

## 2016-01-11 DIAGNOSIS — N200 Calculus of kidney: Secondary | ICD-10-CM | POA: Insufficient documentation

## 2016-01-11 DIAGNOSIS — Z9049 Acquired absence of other specified parts of digestive tract: Secondary | ICD-10-CM | POA: Diagnosis not present

## 2016-01-11 DIAGNOSIS — N858 Other specified noninflammatory disorders of uterus: Secondary | ICD-10-CM | POA: Diagnosis not present

## 2016-01-11 DIAGNOSIS — Z7984 Long term (current) use of oral hypoglycemic drugs: Secondary | ICD-10-CM | POA: Insufficient documentation

## 2016-01-11 DIAGNOSIS — Z79899 Other long term (current) drug therapy: Secondary | ICD-10-CM | POA: Diagnosis not present

## 2016-01-11 DIAGNOSIS — R1031 Right lower quadrant pain: Secondary | ICD-10-CM | POA: Diagnosis present

## 2016-01-11 DIAGNOSIS — M1990 Unspecified osteoarthritis, unspecified site: Secondary | ICD-10-CM | POA: Diagnosis not present

## 2016-01-11 DIAGNOSIS — E669 Obesity, unspecified: Secondary | ICD-10-CM | POA: Insufficient documentation

## 2016-01-11 DIAGNOSIS — Z3202 Encounter for pregnancy test, result negative: Secondary | ICD-10-CM | POA: Insufficient documentation

## 2016-01-11 DIAGNOSIS — N9489 Other specified conditions associated with female genital organs and menstrual cycle: Secondary | ICD-10-CM

## 2016-01-11 DIAGNOSIS — R102 Pelvic and perineal pain: Secondary | ICD-10-CM

## 2016-01-11 HISTORY — DX: Obesity, unspecified: E66.9

## 2016-01-11 LAB — URINALYSIS, ROUTINE W REFLEX MICROSCOPIC
BILIRUBIN URINE: NEGATIVE
Glucose, UA: NEGATIVE mg/dL
KETONES UR: NEGATIVE mg/dL
LEUKOCYTES UA: NEGATIVE
NITRITE: NEGATIVE
PH: 6.5 (ref 5.0–8.0)
PROTEIN: NEGATIVE mg/dL
Specific Gravity, Urine: 1.013 (ref 1.005–1.030)

## 2016-01-11 LAB — WET PREP, GENITAL
SPERM: NONE SEEN
Trich, Wet Prep: NONE SEEN
Yeast Wet Prep HPF POC: NONE SEEN

## 2016-01-11 LAB — LIPASE, BLOOD: Lipase: 35 U/L (ref 11–51)

## 2016-01-11 LAB — CBC
HEMATOCRIT: 41 % (ref 36.0–46.0)
Hemoglobin: 13.2 g/dL (ref 12.0–15.0)
MCH: 27.3 pg (ref 26.0–34.0)
MCHC: 32.2 g/dL (ref 30.0–36.0)
MCV: 84.9 fL (ref 78.0–100.0)
PLATELETS: 268 10*3/uL (ref 150–400)
RBC: 4.83 MIL/uL (ref 3.87–5.11)
RDW: 12.8 % (ref 11.5–15.5)
WBC: 9.9 10*3/uL (ref 4.0–10.5)

## 2016-01-11 LAB — COMPREHENSIVE METABOLIC PANEL
ALBUMIN: 4.1 g/dL (ref 3.5–5.0)
ALK PHOS: 51 U/L (ref 38–126)
ALT: 43 U/L (ref 14–54)
AST: 32 U/L (ref 15–41)
Anion gap: 10 (ref 5–15)
BILIRUBIN TOTAL: 0.8 mg/dL (ref 0.3–1.2)
BUN: 10 mg/dL (ref 6–20)
CO2: 22 mmol/L (ref 22–32)
CREATININE: 0.66 mg/dL (ref 0.44–1.00)
Calcium: 9.5 mg/dL (ref 8.9–10.3)
Chloride: 109 mmol/L (ref 101–111)
GFR calc Af Amer: >60 mL/min (ref >60)
GLUCOSE: 126 mg/dL — AB (ref 65–99)
POTASSIUM: 4.6 mmol/L (ref 3.5–5.1)
Sodium: 141 mmol/L (ref 135–145)
TOTAL PROTEIN: 7.1 g/dL (ref 6.5–8.1)

## 2016-01-11 LAB — I-STAT BETA HCG BLOOD, ED (MC, WL, AP ONLY): I-stat hCG, quantitative: <5 m[IU]/mL (ref <5)

## 2016-01-11 LAB — URINE MICROSCOPIC-ADD ON

## 2016-01-11 MED ORDER — ONDANSETRON HCL 4 MG PO TABS: 4.0000 mg | ORAL_TABLET | Freq: Four times a day (QID) | ORAL | Status: DC

## 2016-01-11 MED ORDER — MORPHINE SULFATE (PF) 4 MG/ML IV SOLN
4.0000 mg | Freq: Once | INTRAVENOUS | Status: AC
  Administered 2016-01-11: 4 mg via INTRAVENOUS
  Filled 2016-01-11: qty 1

## 2016-01-11 MED ORDER — SODIUM CHLORIDE 0.9 % IV BOLUS (SEPSIS)
1000.0000 mL | Freq: Once | INTRAVENOUS | Status: AC
  Administered 2016-01-11: 1000 mL via INTRAVENOUS

## 2016-01-11 MED ORDER — IOPAMIDOL (ISOVUE-300) INJECTION 61%
INTRAVENOUS | Status: AC
  Administered 2016-01-11: 100 mL
  Filled 2016-01-11: qty 100

## 2016-01-11 MED ORDER — ONDANSETRON HCL 4 MG/2ML IJ SOLN
4.0000 mg | Freq: Once | INTRAMUSCULAR | Status: AC
  Administered 2016-01-11: 4 mg via INTRAVENOUS
  Filled 2016-01-11: qty 2

## 2016-01-11 MED ORDER — OXYCODONE-ACETAMINOPHEN 5-325 MG PO TABS: 1.0000 | ORAL_TABLET | ORAL | Status: DC | PRN

## 2016-01-11 MED ORDER — METRONIDAZOLE 500 MG PO TABS
2000.0000 mg | ORAL_TABLET | Freq: Once | ORAL | Status: AC
  Administered 2016-01-11: 2000 mg via ORAL
  Filled 2016-01-11: qty 4

## 2016-01-11 MED ORDER — IBUPROFEN 800 MG PO TABS: 800.0000 mg | ORAL_TABLET | Freq: Three times a day (TID) | ORAL | Status: DC

## 2016-01-11 NOTE — Telephone Encounter (Signed)
Pt seen in ED 

## 2016-01-11 NOTE — ED Provider Notes (Signed)
CSN: [id number]     Arrival date & time 01/11/16  1116 History   First MD Initiated Contact with Patient 01/11/16 1209     Chief Complaint  Patient presents with  . Abdominal Pain     (Consider location/radiation/quality/duration/timing/severity/associated sxs/prior Treatment) HPI Comments: Patient presents with right lower quadrant pain onset about 3:00 this morning. The pain is been constant and never really goes away. No relief with ibuprofen. She's had nausea but no vomiting has had some dry heaves. No diarrhea. No pain with urination or blood in the urine. No vaginal bleeding or discharge. Denies any previous abdominal surgeries other than cholecystectomy. Still has uterus and ovaries. Still has appendix. No history of kidney stones. No chest or back pain.  The history is provided by the patient.    Past Medical History  Diagnosis Date  . Arthritis   . Gestational diabetes   . Environmental allergies   . Hx gestational diabetes   . Obesity    Past Surgical History  Procedure Laterality Date  . Cholecystectomy  2009  . Cystectomy  2003    left breast   Family History  Problem Relation Age of Onset  . Hypertension Maternal Grandfather   . Diabetes Maternal Grandfather   . Stroke Maternal Grandfather   . Heart disease Paternal Grandfather    Social History  Substance Use Topics  . Smoking status: Never Smoker   . Smokeless tobacco: Never Used  . Alcohol Use: No   OB History    Gravida Para Term Preterm AB TAB SAB Ectopic Multiple Living   0     2     Review of Systems  Constitutional: Positive for activity change and appetite change. Negative for fever.  HENT: Negative for congestion.   Eyes: Negative for visual disturbance.  Respiratory: Negative for cough, chest tightness and shortness of breath.   Cardiovascular: Negative for chest pain.  Gastrointestinal: Positive for nausea and abdominal pain. Negative for vomiting and diarrhea.  Genitourinary:  Negative for dysuria, hematuria, vaginal bleeding and vaginal discharge.  Musculoskeletal: Negative for myalgias and arthralgias.  Neurological: Negative for dizziness, weakness and headaches.  A complete 10 system review of systems was obtained and all systems are negative except as noted in the HPI and PMH.      Allergies  Review of patient's allergies indicates no known allergies.  Home Medications   Prior to Admission medications   Medication Sig Start Date End Date Taking? Authorizing Provider  fexofenadine (ALLEGRA) 180 MG tablet Take 180 mg by mouth at bedtime.   Yes Historical Provider, MD  meloxicam (MOBIC) 15 MG tablet Take 15 mg by mouth daily as needed for pain. with food 07/10/15  Yes Historical Provider, MD  metFORMIN (GLUCOPHAGE) 500 MG tablet Take 2 tablets twice daily Patient taking differently: Take 1,000 mg by mouth 2 (two) times daily with a meal.  12/18/15  Yes Kristian Covey, MD  montelukast (SINGULAIR) 10 MG tablet Take 10 mg by mouth at bedtime as needed (for allergies).  05/25/15  Yes Historical Provider, MD  Multiple Vitamin (MULTIVITAMIN) tablet Take 1 tablet by mouth daily.   Yes Historical Provider, MD  naproxen sodium (ANAPROX) 220 MG tablet Take 220-440 mg by mouth daily as needed (for pain).   Yes Historical Provider, MD  ibuprofen (ADVIL,MOTRIN) 800 MG tablet Take 1 tablet (800 mg total) by mouth 3 (three) times daily. 01/11/16   Glynn Octave, MD  ondansetron (ZOFRAN) 4 MG tablet  Take 1 tablet (4 mg total) by mouth every 6 (six) hours. 01/11/16   Glynn Octave, MD  oxyCODONE-acetaminophen (PERCOCET/ROXICET) 5-325 MG tablet Take 1 tablet by mouth every 4 (four) hours as needed for severe pain. 01/11/16   Glynn Octave, MD   BP 131/66 mmHg  Pulse 88  Temp(Src) 98.1 F (36.7 C) (Oral)  Resp 18  Ht 5\' 1"  (1.549 m)  Wt 212 lb (96.163 kg)  BMI 40.08 kg/m2  SpO2 99%  LMP 12/17/2015 Physical Exam  Constitutional: She is oriented to person, place, and  time. She appears well-developed and well-nourished. No distress.  HENT:  Head: Normocephalic and atraumatic.  Mouth/Throat: Oropharynx is clear and moist. No oropharyngeal exudate.  Eyes: Conjunctivae and EOM are normal. Pupils are equal, round, and reactive to light.  Neck: Normal range of motion. Neck supple.  No meningismus.  Cardiovascular: Normal rate, regular rhythm, normal heart sounds and intact distal pulses.   No murmur heard. Pulmonary/Chest: Effort normal and breath sounds normal. No respiratory distress.  Abdominal: Soft. There is tenderness. There is no rebound and no guarding.  TTP RLQ, no guarding or rebound Reducible umbilical hernia.  Genitourinary: Vaginal discharge found.  Chaperone present. Normal external genitalia. White vaginal discharge in vault. No CMT. No lateralizing adnexal tenderness.  Musculoskeletal: Normal range of motion. She exhibits no edema or tenderness.  No CVAT  Neurological: She is alert and oriented to person, place, and time. No cranial nerve deficit. She exhibits normal muscle tone. Coordination normal.  No ataxia on finger to nose bilaterally. No pronator drift. 5/5 strength throughout. CN 2-12 intact.Equal grip strength. Sensation intact.   Skin: Skin is warm.  Psychiatric: She has a normal mood and affect. Her behavior is normal.  Nursing note and vitals reviewed.   ED Course  Procedures (including critical care time) Labs Review Labs Reviewed  WET PREP, GENITAL - Abnormal; Notable for the following:    Clue Cells Wet Prep HPF POC PRESENT (*)    WBC, Wet Prep HPF POC MANY (*)    All other components within normal limits  COMPREHENSIVE METABOLIC PANEL - Abnormal; Notable for the following:    Glucose, Bld 126 (*)    All other components within normal limits  URINALYSIS, ROUTINE W REFLEX MICROSCOPIC (NOT AT [facility]) - Abnormal; Notable for the following:    Hgb urine dipstick MODERATE (*)    All other components within normal limits   URINE MICROSCOPIC-ADD ON - Abnormal; Notable for the following:    Squamous Epithelial / LPF 0-5 (*)    Bacteria, UA RARE (*)    All other components within normal limits  LIPASE, BLOOD  CBC  I-STAT BETA HCG BLOOD, ED ([facility], [facility], [facility] ONLY)  GC/CHLAMYDIA PROBE AMP ([place]) NOT AT [facility]    Imaging Review Ct Abdomen Pelvis W Contrast  01/11/2016  CLINICAL DATA:  Patient with right lower quadrant pain, nausea and vomiting for 3 days. EXAM: CT ABDOMEN AND PELVIS WITH CONTRAST TECHNIQUE: Multidetector CT imaging of the abdomen and pelvis was performed using the standard protocol following bolus administration of intravenous contrast. CONTRAST:  ISOVUE-300 IOPAMIDOL (ISOVUE-300) INJECTION 61% COMPARISON:  Lumbar spine MR 10/09/2012 FINDINGS: Lower chest: Normal heart size. Dependent atelectasis within the bilateral lower lobes. No pleural effusion. Hepatobiliary: Liver is diffusely low in attenuation. Regional areas of increased attenuation likely represent perfusional anomaly or fatty sparing. No intrahepatic or extrahepatic biliary ductal dilatation. Pancreas: Unremarkable Spleen: Unremarkable Adrenals/Urinary Tract: Normal adrenal glands. The right kidney  is enlarged and has delayed enhancement. There is mild right hydroureteronephrosis to distal right ureter at the UVJ were there is an obstructing the level of the 3 mm stone (image 79; series 201). There is a 2 mm stone within the interpolar region the right kidney (image 39; series 201). Urinary bladder is unremarkable. 2 mm nonobstructing stone interpolar region left kidney (image 98; series 2 with 3). 2 mm nonobstructing stone inferior pole right kidney Stomach/Bowel: No abnormal bowel wall thickening or evidence for bowel obstruction. No free fluid or free intraperitoneal air. Normal morphology of the stomach. Vascular/Lymphatic: Normal caliber abdominal aorta. No retroperitoneal lymphadenopathy. Other: Uterus is unremarkable. Central coarse  calcification along the right aspect of the uterus surrounded by low-attenuation soft tissue measuring up to 4.5 cm (image 69; series 201) which may represent a partially degenerative fibroid or potentially an adnexal mass. Left ovary is unremarkable. Musculoskeletal: There is a fat containing ventral abdominal wall hernia (image 55; series 201). There is mild stranding of the herniated fat. Lumbar spine degenerative changes. No aggressive or acute appearing osseous lesions. IMPRESSION: There is an obstructing 3 mm stone within the distal right ureter resulting in mild right hydroureteronephrosis. Fat containing ventral abdominal wall hernia with mild fat stranding suggestive of acute process. Coarse calcification within the right hemipelvis with surrounding low-attenuation soft tissue which may represent a partially degenerated fibroid or a partially calcified ovarian mass. This needs dedicated evaluation with pelvic ultrasound. Bilateral nephrolithiasis. Hepatic steatosis with regional fatty sparing. Electronically Signed   By: Annia Beltrew  Davis M.D.   On: 01/11/2016 15:03   I have personally reviewed and evaluated these images and lab results as part of my medical decision-making.   EKG Interpretation None      MDM   Final diagnoses:  Adnexal mass  Renal stone   Right lower quadrant pain since 3 AM with nausea and dry heaves. No guarding or rebound exam.  Urinalysis shows blood. Concern for possible kidney stone versus appendicitis. We'll obtain CT scan.  Workup was remarkable for 3 mm distal right ureteral stone. Urine is negative for infection. CT also shows inflamed ventral hernia containing fat. This is nontender on physical exam. CT scan reviewed with Dr. Corliss Skainssuei feels patient is appropriate for outpatient follow-up.  CT scan also shows calcified mass and right in the pelvis concerning for fibroid or ovarian mass. Pelvic Ultrasound recommended. Pelvic exam performed.  BV treated.  Patient  declines further pain medication. She feels improved. She tolerated by mouth. We'll refer to urology for kidney stone. We'll also need surgery follow-up for her umbilical hernia. Dr. Karma GanjaLinker to disposition after pelvic ultrasound is complete.  Glynn OctaveStephen Kinser Fellman, MD 01/11/16 1710

## 2016-01-11 NOTE — Telephone Encounter (Signed)
Patient Name: Feliz BeamCHERI Cheatwood  DOB: Nov 27, 1975    Initial Comment Caller states, dtr has severe lower RT side Abd pains and has dry heaves    Nurse Assessment  Nurse: Vickey SagesAtkins, RN, Jacquilin Date/Time (Eastern Time): 01/11/2016 8:57:04 AM  Confirm and document reason for call. If symptomatic, describe symptoms. You must click the next button to save text entered. ---Caller states, her daughter has severe lower RLQ Abd pains and has dry heaves/ diarrhea.  Has the patient traveled out of the country within the last 30 days? ---Not Applicable  Does the patient have any new or worsening symptoms? ---Yes  Will a triage be completed? ---No  Select reason for no triage. ---Other  Please document clinical information provided and list any resource used. ---Checked EMR we do not have mother listed to be able to speak with. Attempted patient but was unable to reach her. Advised mother if the patient is having RLQ pain that is causing her to cry she needs to be seen. Advised caller i would go ahead and take her to the ER. Caller states she has to sign papers at work but will take her after. Stressed to caller if anything changes or gets worse the patient can always call EMS     Guidelines    Guideline Title Affirmed Question Affirmed Notes       Final Disposition User        Comments  reviewed EMR i could not find any note on permission to speak with Lupita LeashDonna- Did not complete triage due to this. Patient is not with caller

## 2016-01-11 NOTE — Discharge Instructions (Signed)
You should talk with your gynecologist- the radiologist has recommended that you have another pelvic ultrasound in 3 months to re-evaluate    Kidney Stones Follow up the urologist. You should be able to pass this kidney stone. Followup with the surgeon regarding your umbilical hernia. Return to the ED if you develop new or worsening symptoms. Kidney stones (urolithiasis) are deposits that form inside your kidneys. The intense pain is caused by the stone moving through the urinary tract. When the stone moves, the ureter goes into spasm around the stone. The stone is usually passed in the urine.  CAUSES   A disorder that makes certain neck glands produce too much parathyroid hormone (primary hyperparathyroidism).  A buildup of uric acid crystals, similar to gout in your joints.  Narrowing (stricture) of the ureter.  A kidney obstruction present at birth (congenital obstruction).  Previous surgery on the kidney or ureters.  Numerous kidney infections. SYMPTOMS   Feeling sick to your stomach (nauseous).  Throwing up (vomiting).  Blood in the urine (hematuria).  Pain that usually spreads (radiates) to the groin.  Frequency or urgency of urination. DIAGNOSIS   Taking a history and physical exam.  Blood or urine tests.  CT scan.  Occasionally, an examination of the inside of the urinary bladder (cystoscopy) is performed. TREATMENT   Observation.  Increasing your fluid intake.  Extracorporeal shock wave lithotripsy--This is a noninvasive procedure that uses shock waves to break up kidney stones.  Surgery may be needed if you have severe pain or persistent obstruction. There are various surgical procedures. Most of the procedures are performed with the use of small instruments. Only small incisions are needed to accommodate these instruments, so recovery time is minimized. The size, location, and chemical composition are all important variables that will determine the proper  choice of action for you. Talk to your health care provider to better understand your situation so that you will minimize the risk of injury to yourself and your kidney.  HOME CARE INSTRUCTIONS   Drink enough water and fluids to keep your urine clear or pale yellow. This will help you to pass the stone or stone fragments.  Strain all urine through the provided strainer. Keep all particulate matter and stones for your health care provider to see. The stone causing the pain may be as small as a grain of salt. It is very important to use the strainer each and every time you pass your urine. The collection of your stone will allow your health care provider to analyze it and verify that a stone has actually passed. The stone analysis will often identify what you can do to reduce the incidence of recurrences.  Only take over-the-counter or prescription medicines for pain, discomfort, or fever as directed by your health care provider.  Keep all follow-up visits as told by your health care provider. This is important.  Get follow-up X-rays if required. The absence of pain does not always mean that the stone has passed. It may have only stopped moving. If the urine remains completely obstructed, it can cause loss of kidney function or even complete destruction of the kidney. It is your responsibility to make sure X-rays and follow-ups are completed. Ultrasounds of the kidney can show blockages and the status of the kidney. Ultrasounds are not associated with any radiation and can be performed easily in a matter of minutes.  Make changes to your daily diet as told by your health care provider. You may be told to:  Limit the amount of salt that you eat.  Eat 5 or more servings of fruits and vegetables each day.  Limit the amount of meat, poultry, fish, and eggs that you eat.  Collect a 24-hour urine sample as told by your health care provider.You may need to collect another urine sample every 6-12  months. SEEK MEDICAL CARE IF:  You experience pain that is progressive and unresponsive to any pain medicine you have been prescribed. SEEK IMMEDIATE MEDICAL CARE IF:   Pain cannot be controlled with the prescribed medicine.  You have a fever or shaking chills.  The severity or intensity of pain increases over 18 hours and is not relieved by pain medicine.  You develop a new onset of abdominal pain.  You feel faint or pass out.  You are unable to urinate.   This information is not intended to replace advice given to you by your health care provider. Make sure you discuss any questions you have with your health care provider.   Document Released: 08/25/2005 Document Revised: 05/16/2015 Document Reviewed: 01/26/2013 Elsevier Interactive Patient Education [employer].

## 2016-01-11 NOTE — Telephone Encounter (Signed)
Spoke to the pt.  Informed her that I have spoken to Dr. Caryl NeverBurchette and he agrees that she should be seen at the ED.  Pt agreed.  Will go when her mother gets back.

## 2016-01-11 NOTE — ED Notes (Signed)
Pt reports onset this am 0300 of RLQ pain and n/v. Denies fever or diarrhea.

## 2016-01-11 NOTE — ED Notes (Signed)
Pelvic cart at bedside. 

## 2016-01-11 NOTE — Telephone Encounter (Signed)
Spoke to the pt.  Pain is on the lower right side.  Started during the night and has worsened as the morning goes on.  At times the pain is so bad she is unable to stand straight up.  She has tried Aleve and Naproxen for the pain.  She has had dry heaves and nausea.  Denied fever.  She expects her mom to come back in an hour and will then go to the ED.  Will hold note to check on the pt.

## 2016-01-11 NOTE — ED Notes (Signed)
Patient verbalized understanding of discharge instructions and denies any further needs or questions at this time. VS stable. Patient ambulatory with steady gait.  

## 2016-01-14 LAB — GC/CHLAMYDIA PROBE AMP (~~LOC~~) NOT AT ARMC
Chlamydia: NEGATIVE
Neisseria Gonorrhea: NEGATIVE

## 2016-01-29 IMAGING — CR DG ANKLE COMPLETE 3+V*L*
3 series · 3 of 3 positions shown · non-contrast
Comparison: None.

CLINICAL DATA: Fall.  Lateral ankle pain and swelling.

EXAM:
LEFT ANKLE COMPLETE - 3+ VIEW

[view not recorded (1 of 3)]
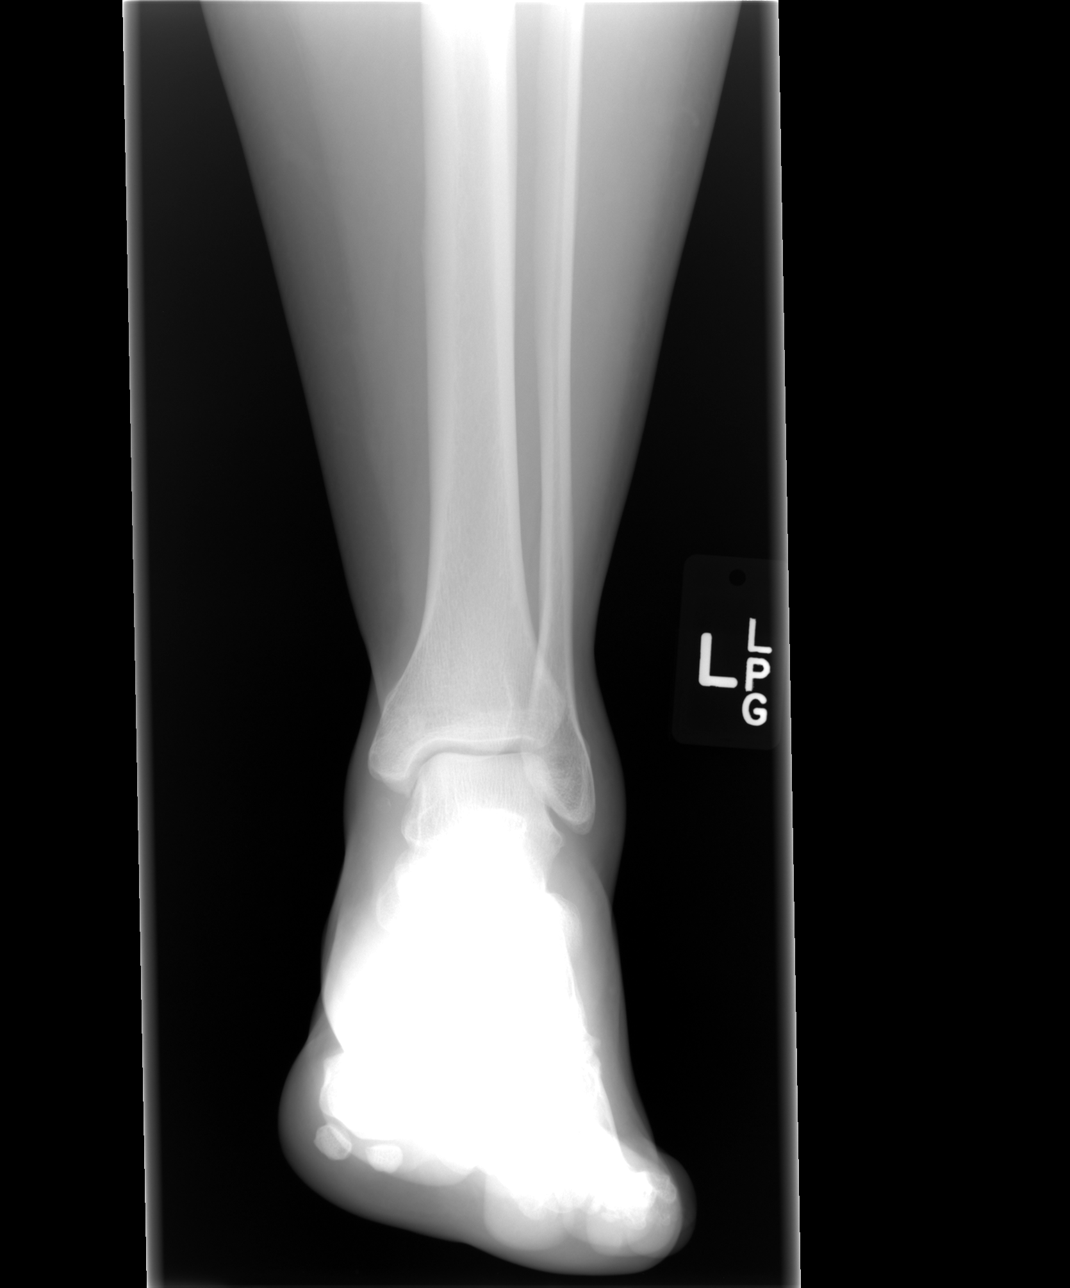

[view not recorded (2 of 3)]
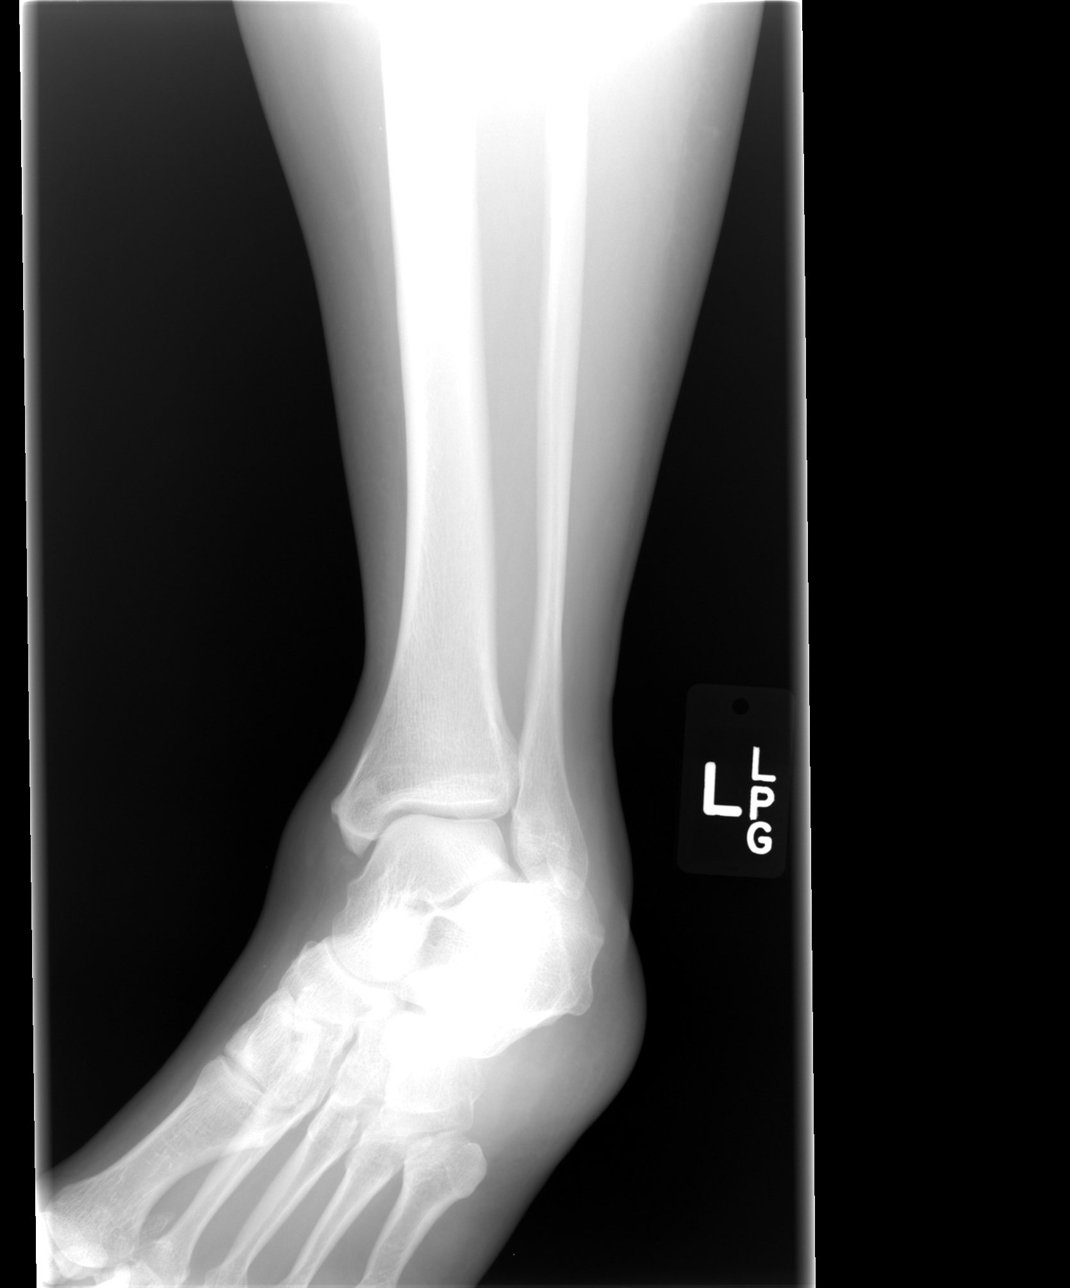

[view not recorded (3 of 3)]
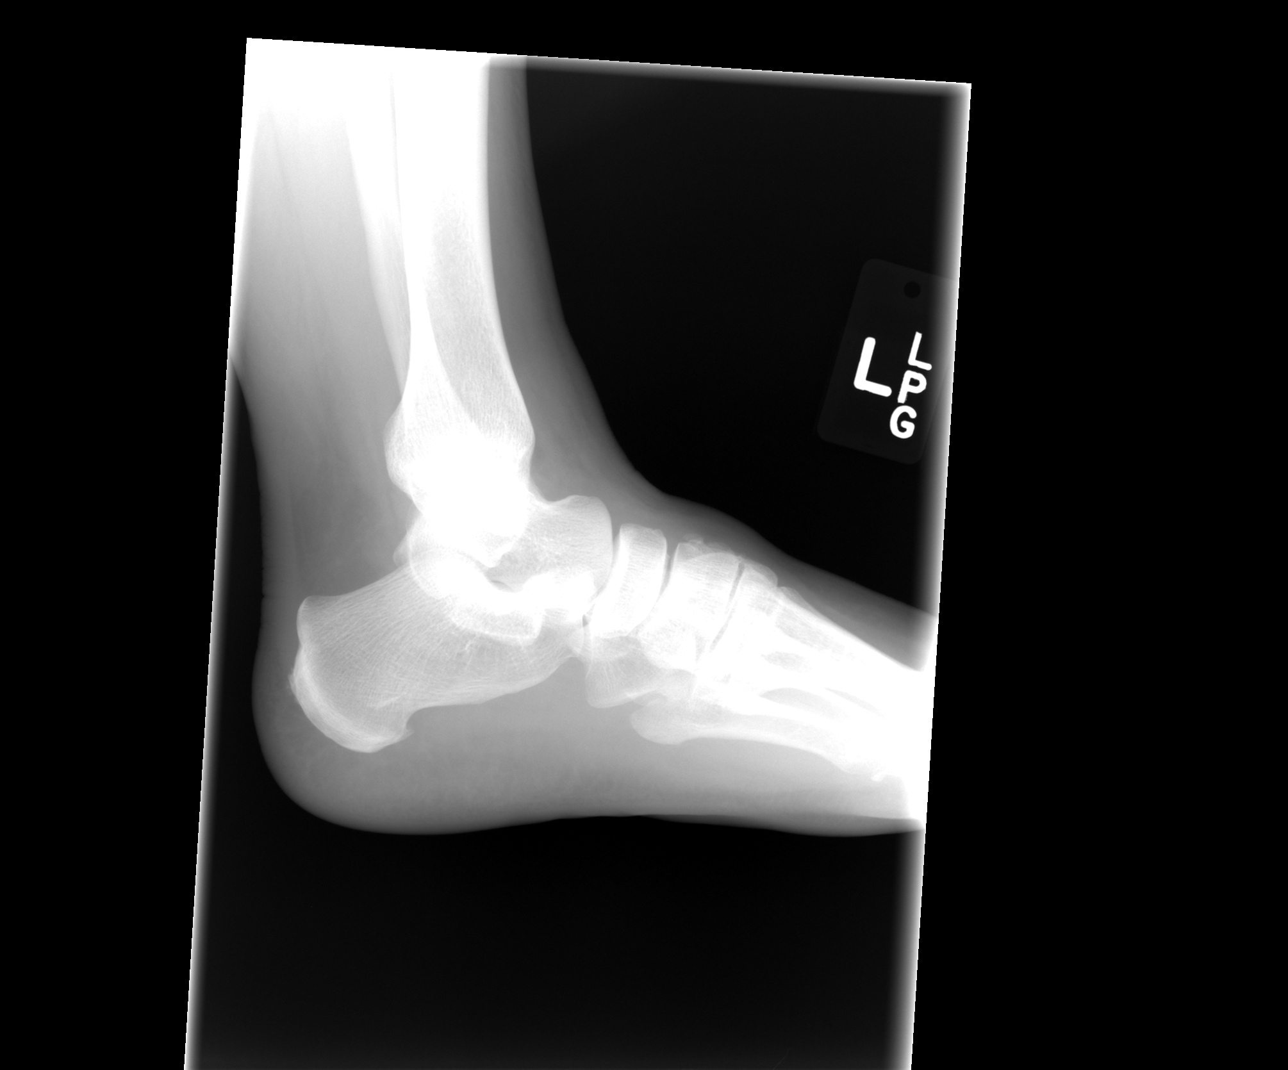

[3 of 3 positions shown; findings below may reference images not displayed]

FINDINGS: Soft tissue swelling is present over the lateral malleolus. A well
corticated toes medial malleolar fragment is likely related to
remote trauma. The joint is located. There is no underlying
fracture. There is no significant joint effusion. The dorsal foot is
otherwise intact.
IMPRESSION: 1. Soft tissue swelling over the lateral malleolus without an
underlying acute fracture.
2. Well corticated fragment along the medial malleolus may be
related to remote trauma.

## 2016-02-13 ENCOUNTER — Encounter (HOSPITAL_BASED_OUTPATIENT_CLINIC_OR_DEPARTMENT_OTHER): Payer: Self-pay

## 2016-03-19 ENCOUNTER — Ambulatory Visit (INDEPENDENT_AMBULATORY_CARE_PROVIDER_SITE_OTHER): Payer: BLUE CROSS/BLUE SHIELD | Admitting: Family Medicine

## 2016-03-19 ENCOUNTER — Encounter: Payer: Self-pay | Admitting: Family Medicine

## 2016-03-19 DIAGNOSIS — E119 Type 2 diabetes mellitus without complications: Secondary | ICD-10-CM | POA: Diagnosis not present

## 2016-03-19 LAB — POCT GLYCOSYLATED HEMOGLOBIN (HGB A1C): HEMOGLOBIN A1C: 8.1

## 2016-03-19 NOTE — Progress Notes (Addendum)
   Subjective:    Patient ID: Tracey Anderson, female    DOB: 1975-12-19, 40 y.o.   MRN: [id number]  HPI  Follow-up type 2 diabetes. Recently diagnosed. Currently on metformin 1000 mg twice daily. Unfortunately, she's not lost any weight. No consistent exercise. Poor compliance with diet over the past couple of months. Not monitoring blood sugars. No polyuria or polydipsia. Positive family history of type 2 diabetes. No neuropathy symptoms  Past Medical History  Diagnosis Date  . Arthritis   . Gestational diabetes   . Environmental allergies   . Hx gestational diabetes   . Obesity    Past Surgical History  Procedure Laterality Date  . Cholecystectomy  2009  . Cystectomy  2003    left breast    reports that she has never smoked. She has never used smokeless tobacco. She reports that she does not drink alcohol or use illicit drugs. family history includes Diabetes in her maternal grandfather; Heart disease in her paternal grandfather; Hypertension in her maternal grandfather; Stroke in her maternal grandfather. No Known Allergies    Review of Systems  Constitutional: Negative for fatigue.  Eyes: Negative for visual disturbance.  Respiratory: Negative for cough, chest tightness, shortness of breath and wheezing.   Cardiovascular: Negative for chest pain, palpitations and leg swelling.  Neurological: Negative for dizziness, seizures, syncope, weakness, light-headedness and headaches.       Objective:   Physical Exam  Constitutional: She appears well-developed and well-nourished.  Eyes: Pupils are equal, round, and reactive to light.  Neck: Neck supple. No JVD present. No thyromegaly present.  Cardiovascular: Normal rate and regular rhythm.  Exam reveals no gallop.   Pulmonary/Chest: Effort normal and breath sounds normal. No respiratory distress. She has no wheezes. She has no rales.  Musculoskeletal: She exhibits no edema.  Neurological: She is alert.            Assessment & Plan:  Type 2 diabetes. Poorly controlled. Repeat A1c today 8.1%. Poor compliance with diet. We discussed options including additional medication versus making some serious dietary changes and losing some weight and she prefers the latter option and reassess in 3 months. We have recommended referral to certified diabetes educator but she declines at this time. Would consider addition of GLP-1  or S GLT-2 medication at follow-up if not further improved.  Kristian CoveyBruce W Jerome Otter MD [facility]

## 2016-03-19 NOTE — Progress Notes (Signed)
Pre visit review using our clinic review tool, if applicable. No additional management support is needed unless otherwise documented below in the visit note. 

## 2016-03-19 NOTE — Patient Instructions (Signed)
Lose some weight Reduce sugars and starches Try to establish more regular exercise. Let me know if you would like to meet with a dietician.

## 2016-04-01 ENCOUNTER — Ambulatory Visit (HOSPITAL_BASED_OUTPATIENT_CLINIC_OR_DEPARTMENT_OTHER): Payer: BLUE CROSS/BLUE SHIELD | Attending: Family Medicine | Admitting: Internal Medicine

## 2016-04-01 VITALS — Ht 61.0 in | Wt 212.0 lb

## 2016-04-01 DIAGNOSIS — G4733 Obstructive sleep apnea (adult) (pediatric): Secondary | ICD-10-CM | POA: Insufficient documentation

## 2016-04-01 DIAGNOSIS — R5383 Other fatigue: Secondary | ICD-10-CM | POA: Insufficient documentation

## 2016-04-01 DIAGNOSIS — R0683 Snoring: Secondary | ICD-10-CM | POA: Insufficient documentation

## 2016-04-01 DIAGNOSIS — G4719 Other hypersomnia: Secondary | ICD-10-CM | POA: Diagnosis present

## 2016-04-01 DIAGNOSIS — E119 Type 2 diabetes mellitus without complications: Secondary | ICD-10-CM | POA: Insufficient documentation

## 2016-04-01 DIAGNOSIS — R400 Somnolence: Secondary | ICD-10-CM | POA: Insufficient documentation

## 2016-04-01 DIAGNOSIS — Z6841 Body Mass Index (BMI) 40.0 and over, adult: Secondary | ICD-10-CM | POA: Insufficient documentation

## 2016-04-01 DIAGNOSIS — E669 Obesity, unspecified: Secondary | ICD-10-CM | POA: Diagnosis not present

## 2016-04-01 DIAGNOSIS — R51 Headache: Secondary | ICD-10-CM | POA: Diagnosis not present

## 2016-04-12 DIAGNOSIS — G4710 Hypersomnia, unspecified: Secondary | ICD-10-CM

## 2016-04-12 NOTE — Procedures (Signed)
  Patient Name: Tracey Anderson, Tracey Anderson Date: 04/01/2016 Gender: Female D.O.B: September 04, 1976 Age (years): 39 Referring Provider: Elberta Fortis Burchette Height (inches): 61 Interpreting Physician: Jetty Duhamel MD, ABSM Weight (lbs): 212 RPSGT: Ulyess Mort BMI: 40 MRN: [id number] Neck Size: 14.50 CLINICAL INFORMATION Sleep Study Type: NPSG Indication for sleep study: Diabetes, Excessive Daytime Sleepiness, Fatigue, Morning Headaches, Obesity, Snoring Epworth Sleepiness Score: 14  SLEEP STUDY TECHNIQUE As per the AASM Manual for the Scoring of Sleep and Associated Events v2.3 (April 2016) with a hypopnea requiring 4% desaturations. The channels recorded and monitored were frontal, central and occipital EEG, electrooculogram (EOG), submentalis EMG (chin), nasal and oral airflow, thoracic and abdominal wall motion, anterior tibialis EMG, snore microphone, electrocardiogram, and pulse oximetry.  MEDICATIONS Patient's medications include: charted for review. Medications self-administered by patient during sleep study : No sleep medicine administered.  SLEEP ARCHITECTURE The study was initiated at 10:05:04 PM and ended at 4:23:06 AM. Sleep onset time was 27.4 minutes and the sleep efficiency was 70.9%. The total sleep time was 268.0 minutes. Stage REM latency was N/A minutes. The patient spent 18.10% of the night in stage N1 sleep, 81.72% in stage N2 sleep, 0.19% in stage N3 and 0.00% in REM. Alpha intrusion was absent. Supine sleep was 3.92%. Wake after sleep onset 82 minutes  RESPIRATORY PARAMETERS The overall apnea/hypopnea index (AHI) was 8.3 per hour. There were 6 total apneas, including 6 obstructive, 0 central and 0 mixed apneas. There were 31 hypopneas and 169 RERAs. The AHI during Stage REM sleep was N/A per hour. AHI while supine was 0.0 per hour. The mean oxygen saturation was 94.83%. The minimum SpO2 during sleep was 89.00%. Moderate snoring was noted during this  study.  CARDIAC DATA The 2 lead EKG demonstrated sinus rhythm. The mean heart rate was 73.60 beats per minute. Other EKG findings include: None.  LEG MOVEMENT DATA The total PLMS were 80 with a resulting PLMS index of 17.91. Associated arousal with leg movement index was 0.9 .  IMPRESSIONS - Mild obstructive sleep apnea occurred during this study (AHI = 8.3/h). - Insufficent early events to meet protocol requirements for split CPAP titration - No significant central sleep apnea occurred during this study (CAI = 0.0/h). - The patient had minimal or no oxygen desaturation during the study (Min O2 = 89.00%) - The patient snored with Moderate snoring volume. - No cardiac abnormalities were noted during this study. - Mild periodic limb movements of sleep occurred during the study. No significant associated arousals.  DIAGNOSIS - Obstructive Sleep Apnea (327.23 [G47.33 ICD-10])  RECOMMENDATIONS - Return to provider to discuss management of very mild OSA. CPAP, oral appliance, or chin strap might be among considerations. - Avoid alcohol, sedatives and other CNS depressants that may worsen sleep apnea and disrupt normal sleep architecture. - Sleep hygiene should be reviewed to assess factors that may improve sleep quality. - Weight management and regular exercise should be initiated or continued if appropriate.  [Electronically signed] 04/12/2016 02:31 PM  Jetty Duhamel MD, ABSM Diplomate, American Board of Sleep Medicine   NPI: [id number] Waymon Budge Diplomate, American Board of Sleep Medicine  ELECTRONICALLY SIGNED ON:  04/12/2016, 2:28 PM [facility] PH: [phone]   FX: [phone] ACCREDITED BY THE AMERICAN ACADEMY OF SLEEP MEDICINE

## 2016-04-13 NOTE — Progress Notes (Signed)
Sleep study showed only mild obstructive sleep apnea.  Would recommend weight loss as primary intervention at this time.  We will discuss in more detail at her next visit.

## 2016-05-07 ENCOUNTER — Encounter: Payer: Self-pay | Admitting: Family Medicine

## 2016-05-07 LAB — HM DIABETES EYE EXAM

## 2016-06-18 ENCOUNTER — Ambulatory Visit: Payer: Self-pay | Admitting: Family Medicine

## 2016-11-02 ENCOUNTER — Emergency Department (HOSPITAL_COMMUNITY)
Admission: EM | Admit: 2016-11-02 | Discharge: 2016-11-02 | Disposition: A | Discharge status: Home / Self Care | Payer: BLUE CROSS/BLUE SHIELD | Attending: Emergency Medicine | Admitting: Emergency Medicine

## 2016-11-02 ENCOUNTER — Encounter (HOSPITAL_COMMUNITY): Payer: Self-pay | Admitting: Emergency Medicine

## 2016-11-02 DIAGNOSIS — Z7984 Long term (current) use of oral hypoglycemic drugs: Secondary | ICD-10-CM | POA: Insufficient documentation

## 2016-11-02 DIAGNOSIS — Z79899 Other long term (current) drug therapy: Secondary | ICD-10-CM | POA: Insufficient documentation

## 2016-11-02 DIAGNOSIS — L600 Ingrowing nail: Secondary | ICD-10-CM

## 2016-11-02 LAB — CBG MONITORING, ED: GLUCOSE-CAPILLARY: 264 mg/dL — AB (ref 65–99)

## 2016-11-02 MED ORDER — LIDOCAINE HCL 2 % IJ SOLN
20.0000 mL | Freq: Once | INTRAMUSCULAR | Status: AC
  Administered 2016-11-02: 400 mg
  Filled 2016-11-02: qty 20

## 2016-11-02 NOTE — ED Triage Notes (Signed)
Pt here for left great toe pain from ingrown toenail

## 2016-11-02 NOTE — ED Provider Notes (Signed)
[facility]EMERGENCY DEPT Provider Note   CSN: [id number] Arrival date & time: 11/02/16  1106  By signing my name below, I, Tracey Anderson, attest that this documentation has been prepared under the direction and in the presence of physician practitioner, Cathren Laine, MD. Electronically Signed: Linna Anderson, Scribe. 11/02/2016. 12:15 PM.  History   Chief Complaint Chief Complaint  Patient presents with  . Toe Pain    The history is provided by the patient. No language interpreter was used.     HPI Comments: Tracey Anderson is a 41 y.o. female with PMHx including DM type II and arthritis who presents to the Emergency Department complaining of constant left big toe pain for about one week. No known trauma or injury to her left big toe. She states her pain is worse with applied pressure around the nailbed of her left big toe. No alleviating factors noted. Pt believes she may have an ingrown toenail; she reports a h/o ingrown toenails that she has been able to fix herself. NKDA. She denies numbness/tingling, fever, or any other associated symptoms.  Past Medical History:  Diagnosis Date  . Arthritis   . Environmental allergies   . Gestational diabetes   . Hx gestational diabetes   . Obesity     Patient Active Problem List   Diagnosis Date Noted  . Daytime somnolence 04/01/2016  . New onset type 2 diabetes mellitus (HCC) 09/21/2015  . DEPRESSION 02/19/2010  . ANXIETY STATE, UNSPECIFIED 01/30/2010  . COUGH, CHRONIC 01/30/2010  . OBESITY 01/21/2010  . PARESTHESIA 01/21/2010  . HEADACHE 01/21/2010  . URINARY INCONTINENCE 01/21/2010    Past Surgical History:  Procedure Laterality Date  . CHOLECYSTECTOMY  2009  . CYSTECTOMY  2003   left breast    OB History    Gravida Para Term Preterm AB Living   2 2 1 1  0 2   SAB TAB Ectopic Multiple Live Births           1       Home Medications    Prior to Admission medications   Medication Sig Start Date End Date Taking?  Authorizing Provider  fexofenadine (ALLEGRA) 180 MG tablet Take 180 mg by mouth at bedtime.    Historical Provider, MD  ibuprofen (ADVIL,MOTRIN) 800 MG tablet Take 1 tablet (800 mg total) by mouth 3 (three) times daily. 01/11/16   Glynn Octave, MD  meloxicam (MOBIC) 15 MG tablet Take 15 mg by mouth daily as needed for pain. with food 07/10/15   Historical Provider, MD  metFORMIN (GLUCOPHAGE) 500 MG tablet Take 2 tablets twice daily Patient taking differently: Take 1,000 mg by mouth 2 (two) times daily with a meal.  12/18/15   Kristian Covey, MD  Multiple Vitamin (MULTIVITAMIN) tablet Take 1 tablet by mouth daily.    Historical Provider, MD  naproxen sodium (ANAPROX) 220 MG tablet Take 220-440 mg by mouth daily as needed (for pain).    Historical Provider, MD    Family History Family History  Problem Relation Age of Onset  . Hypertension Maternal Grandfather   . Diabetes Maternal Grandfather   . Stroke Maternal Grandfather   . Heart disease Paternal Grandfather     Social History Social History  Substance Use Topics  . Smoking status: Never Smoker  . Smokeless tobacco: Never Used  . Alcohol use No     Allergies   Patient has no known allergies.   Review of Systems Review of Systems  Constitutional: Negative for fever.  Musculoskeletal: Positive for arthralgias.  Neurological: Negative for numbness.     Physical Exam Updated Vital Signs BP (!) 162/106 (BP Location: Right Arm)   Pulse 106   Temp 97.6 F (36.4 C) (Oral)   Resp (!) 174   Ht 5\' 1"  (1.549 m)   Wt 215 lb (97.5 kg)   SpO2 99%   BMI 40.62 kg/m   Physical Exam  Constitutional: She is oriented to person, place, and time. She appears well-developed and well-nourished. No distress.  HENT:  Head: Normocephalic and atraumatic.  Eyes: Conjunctivae and EOM are normal.  Neck: Neck supple. No tracheal deviation present.  Cardiovascular: Normal rate.   Pulmonary/Chest: Effort normal. No respiratory distress.    Musculoskeletal: Normal range of motion.  Ingrown left great toenail.  Neurological: She is alert and oriented to person, place, and time.  Skin: Skin is warm and dry.  Psychiatric: She has a normal mood and affect. Her behavior is normal.  Nursing note and vitals reviewed.    ED Treatments / Results  Labs (all labs ordered are listed, but only abnormal results are displayed) Labs Reviewed  CBG MONITORING, ED - Abnormal; Notable for the following:       Result Value   Glucose-Capillary 264 (*)    All other components within normal limits    EKG  EKG Interpretation None       Radiology No results found.  Procedures .Nail Removal Date/Time: 11/02/2016 1:20 PM Performed by: Cathren LaineSTEINL, Markeita Alicia Authorized by: Cathren LaineSTEINL, Kripa Foskey   Consent:    Consent obtained:  Verbal   Consent given by:  Patient Location:    Foot:  L big toe Pre-procedure details:    Skin preparation:  Betadine Anesthesia (see MAR for exact dosages):    Anesthesia method:  Nerve block   Block needle gauge:  27 G   Block anesthetic:  Lidocaine 2% w/o epi   Block technique:  Digital   Block outcome:  Anesthesia achieved Nail Removal:    Nail removed:  Partial Ingrown nail:    Wedge excision of skin: yes   Post-procedure details:    Dressing:  4x4 sterile gauze   Patient tolerance of procedure:  Tolerated well, no immediate complications   (including critical care time)  DIAGNOSTIC STUDIES: Oxygen Saturation is 99% on RA, normal by my interpretation.    COORDINATION OF CARE: 12:19 PM Discussed treatment plan with pt at bedside and pt agreed to plan.  Medications Ordered in ED Medications - No data to display   Initial Impression / Assessment and Plan / ED Course  I have reviewed the triage vital signs and the nursing notes.  Pertinent labs & imaging results that were available during my care of the patient were reviewed by me and considered in my medical decision making (see chart for  details).  Edges of ingrown nail removed.  Pt tolerated well, digital block worked very well.   Sterile dressing.   Recheck, normal cap refill distally. No pain.   Final Clinical Impressions(s) / ED Diagnoses   Final diagnoses:  None    New Prescriptions New Prescriptions   No medications on file   I personally performed the services described in this documentation, which was scribed in my presence. The recorded information has been reviewed and considered. Cathren LaineKevin Wessie Shanks, MD    Cathren LaineKevin Bonney Berres, MD 11/02/16 [phone]

## 2016-11-02 NOTE — ED Notes (Signed)
Declined W/C at D/C and was escorted to lobby by RN. 

## 2016-11-02 NOTE — Discharge Instructions (Signed)
It was our pleasure to provide your ER care today - we hope that you feel better.  Keep area very clean, wash with soap and water 2x/day.  Take tylenol/advil as need.   Return to ER if worse, severe pain, spreading redness, other concern.

## 2016-11-06 ENCOUNTER — Encounter (HOSPITAL_COMMUNITY): Payer: Self-pay

## 2016-11-06 ENCOUNTER — Emergency Department (HOSPITAL_COMMUNITY)
Admission: EM | Admit: 2016-11-06 | Discharge: 2016-11-06 | Disposition: A | Discharge status: Home / Self Care | Payer: BLUE CROSS/BLUE SHIELD | Attending: Emergency Medicine | Admitting: Emergency Medicine

## 2016-11-06 DIAGNOSIS — Z7984 Long term (current) use of oral hypoglycemic drugs: Secondary | ICD-10-CM | POA: Insufficient documentation

## 2016-11-06 DIAGNOSIS — E119 Type 2 diabetes mellitus without complications: Secondary | ICD-10-CM | POA: Insufficient documentation

## 2016-11-06 DIAGNOSIS — Z79899 Other long term (current) drug therapy: Secondary | ICD-10-CM | POA: Insufficient documentation

## 2016-11-06 DIAGNOSIS — L03032 Cellulitis of left toe: Secondary | ICD-10-CM | POA: Insufficient documentation

## 2016-11-06 MED ORDER — CEPHALEXIN 500 MG PO CAPS: 500.0000 mg | ORAL_CAPSULE | Freq: Four times a day (QID) | ORAL | 0 refills | Status: DC

## 2016-11-06 MED ORDER — SULFAMETHOXAZOLE-TRIMETHOPRIM 800-160 MG PO TABS: 1.0000 | ORAL_TABLET | Freq: Two times a day (BID) | ORAL | 0 refills | Status: AC

## 2016-11-06 NOTE — ED Notes (Signed)
Pt c/o left big toe redness and swelling. Pt was here on Sunday to have nail clipped. See providers assessment.

## 2016-11-06 NOTE — Discharge Instructions (Signed)
Take your antibiotics as prescribed until completed. I recommend doing warm foot soaks 3-4 times daily for 15-20 minutes in trying to massage the area to help push out any drainage present.  Follow-up with your primary care provider or return to the ED in 2 days for wound recheck. Please return to the Emergency Department if symptoms worsen or new onset of fever, worsening redness/swelling/drainage, numbness, weakness, decreased range of motion, vomiting.

## 2016-11-06 NOTE — ED Notes (Signed)
Pt stable, understands discharge instructions, and reasons for return.   

## 2016-11-06 NOTE — ED Triage Notes (Signed)
Per pt, PT is coming from home with complaints of left greater toe infection. Reports ingrown toe nail that was evaluated a couple days ago. Reports getting worse with some yellow drainage noted.

## 2016-11-06 NOTE — ED Provider Notes (Signed)
[facility]PT Provider Note   CSN: [id number] Arrival date & time: 11/06/16  1908   By signing my name below, I, [name], attest that this documentation has been prepared under the direction and in the presence of Melburn Hake, PA-C Electronically Signed: [name], ED Scribe. 11/06/16. 10:41 PM.  History   Chief Complaint Chief Complaint  Patient presents with  . Wound Infection    HPI Tracey Anderson is a 41 y.o. female with a PMHx of DM, who presents to the Emergency Department complaining of wound infection to left great toe onset 1 month ago. Pt reports associated worsening redness to left great toe, drainage to left great toe, and pressure sensation to left great toe for the past 2 days. Pt has tried warm soaks with peroxide without medications with no relief of her symptoms. Pt was evaluated in the ED on 11/02/2016 for her symptoms with the edges of the ingrown nail removed bilaterally but notes the redness, swelling and drainage started a few days later. She denies numbness, tingling, fever, vomiting, and any other symptoms. Denies any recent antibiotic use.    The history is provided by the patient. No language interpreter was used.    Past Medical History:  Diagnosis Date  . Arthritis   . Environmental allergies   . Gestational diabetes   . Hx gestational diabetes   . Obesity     Patient Active Problem List   Diagnosis Date Noted  . Daytime somnolence 04/01/2016  . New onset type 2 diabetes mellitus (HCC) 09/21/2015  . DEPRESSION 02/19/2010  . ANXIETY STATE, UNSPECIFIED 01/30/2010  . COUGH, CHRONIC 01/30/2010  . OBESITY 01/21/2010  . PARESTHESIA 01/21/2010  . HEADACHE 01/21/2010  . URINARY INCONTINENCE 01/21/2010    Past Surgical History:  Procedure Laterality Date  . CHOLECYSTECTOMY  2009  . CYSTECTOMY  2003   left breast    OB History    Gravida Para Term Preterm AB Living   2 2 1 1  0 2   SAB TAB Ectopic Multiple Live Births           1        Home Medications    Prior to Admission medications   Medication Sig Start Date End Date Taking? Authorizing Provider  cephALEXin (KEFLEX) 500 MG capsule Take 1 capsule (500 mg total) by mouth 4 (four) times daily. 11/06/16   Barrett Henle, PA-C  fexofenadine (ALLEGRA) 180 MG tablet Take 180 mg by mouth at bedtime.    Historical Provider, MD  ibuprofen (ADVIL,MOTRIN) 800 MG tablet Take 1 tablet (800 mg total) by mouth 3 (three) times daily. 01/11/16   Glynn Octave, MD  meloxicam (MOBIC) 15 MG tablet Take 15 mg by mouth daily as needed for pain. with food 07/10/15   Historical Provider, MD  metFORMIN (GLUCOPHAGE) 500 MG tablet Take 2 tablets twice daily Patient taking differently: Take 1,000 mg by mouth 2 (two) times daily with a meal.  12/18/15   Kristian Covey, MD  Multiple Vitamin (MULTIVITAMIN) tablet Take 1 tablet by mouth daily.    Historical Provider, MD  naproxen sodium (ANAPROX) 220 MG tablet Take 220-440 mg by mouth daily as needed (for pain).    Historical Provider, MD  sulfamethoxazole-trimethoprim (BACTRIM DS,SEPTRA DS) 800-160 MG tablet Take 1 tablet by mouth 2 (two) times daily. 11/06/16 11/13/16  Barrett Henle, PA-C    Family History Family History  Problem Relation Age of Onset  . Hypertension Maternal Grandfather   .  Diabetes Maternal Grandfather   . Stroke Maternal Grandfather   . Heart disease Paternal Grandfather     Social History Social History  Substance Use Topics  . Smoking status: Never Smoker  . Smokeless tobacco: Never Used  . Alcohol use No     Allergies   Patient has no known allergies.   Review of Systems Review of Systems  Constitutional: Negative for fever.  Gastrointestinal: Negative for vomiting.  Skin: Positive for color change (redness to left great toe) and wound (healing hangnail site to left great toenail with drainage).  Neurological: Negative for numbness.       No tingling     Physical Exam Updated  Vital Signs BP 142/84 (BP Location: Right Arm)   Pulse 88   Temp 98.5 F (36.9 C) (Oral)   Resp 17   Ht 5\' 1"  (1.549 m)   Wt 97.5 kg   SpO2 98%   BMI 40.62 kg/m   Physical Exam  Constitutional: She is oriented to person, place, and time. She appears well-developed and well-nourished.  HENT:  Head: Normocephalic and atraumatic.  Eyes: Conjunctivae and EOM are normal. Right eye exhibits no discharge. Left eye exhibits no discharge. No scleral icterus.  Cardiovascular: Normal rate and intact distal pulses.   Pulmonary/Chest: Effort normal.  Musculoskeletal:  FROM of left great toe, foot, and ankle with 5/5 strength. Sensation grossly intact. 2+ dp pulse. Cap refill less than 2 seconds.  Neurological: She is alert and oriented to person, place, and time.  Skin: Skin is warm and dry. Capillary refill takes less than 2 seconds. There is erythema.  Mild swelling and erythema with warmth noted to lateral sides of left great toenail. Small amount of yellow serous drainage expressed with palpation.   Nursing note and vitals reviewed.      ED Treatments / Results  DIAGNOSTIC STUDIES: Oxygen Saturation is 99% on RA, nl by my interpretation.    COORDINATION OF CARE: 10:27 PM Discussed treatment plan with pt at bedside which includes keflex and bactrim Rx and pt agreed to plan.  Procedures Procedures (including critical care time)  Medications Ordered in ED Medications - No data to display   Initial Impression / Assessment and Plan / ED Course  I have reviewed the triage vital signs and the nursing notes.     Patient presents with worsening redness, swelling and drainage to her left great toe for the past 2 days. She states she was initially seen in the ED on 2/25 for ingrown toenails and had both sides of her toenail removed in the ED. Patient was not started on antibiotics at that time due to no signs of associated infection. Denies fever. Hx DM. VSS. Exam revealed mild swelling,  erythema, warmth and drainage to bilateral nail folds, mild tenderness to palpation. Left foot neurovascularly intact. Presentation consistent with her neck yet. Due to patient with recent ingrown toe nail removal in the ED and toenail actively draining, will start patient on antibiotics and discharged home with symptomatically treatment including warm foot soaks. Advised patient to return to the ED in 2 days for wound recheck.  Final Clinical Impressions(s) / ED Diagnoses   Final diagnoses:  Paronychia of great toe, left    New Prescriptions New Prescriptions   CEPHALEXIN (KEFLEX) 500 MG CAPSULE    Take 1 capsule (500 mg total) by mouth 4 (four) times daily.   SULFAMETHOXAZOLE-TRIMETHOPRIM (BACTRIM DS,SEPTRA DS) 800-160 MG TABLET    Take 1 tablet by mouth 2 (  two) times daily.   I personally performed the services described in this documentation, which was scribed in my presence. The recorded information has been reviewed and is accurate.     Satira Sark [place], [place] 11/06/16 2246    Benjiman Core, MD 11/07/16 [phone]

## 2017-04-10 IMAGING — US US ART/VEN ABD/PELV/SCROTUM DOPPLER LTD
1 series · 13 of 25 positions shown · non-contrast
Comparison: CT abdomen pelvis earlier same date 01/11/2016.

CLINICAL DATA: Patient with calcification possible adnexal mass.
Evaluate for torsion.

EXAM:
TRANSABDOMINAL AND TRANSVAGINAL ULTRASOUND OF PELVIS
DOPPLER ULTRASOUND OF OVARIES
TECHNIQUE: Both transabdominal and transvaginal ultrasound examinations of the
pelvis were performed. Transabdominal technique was performed for
global imaging of the pelvis including uterus, ovaries, adnexal
regions, and pelvic cul-de-sac.
It was necessary to proceed with endovaginal exam following the
transabdominal exam to visualize the adnexal structures. Color and
duplex Doppler ultrasound was utilized to evaluate blood flow to the
ovaries.

[Series 1: us art/ven abd/pelv/scrotum doppler ltd · 0.27mm/px · 68 acquisitions, 13 frames shown]
[im 1/68]
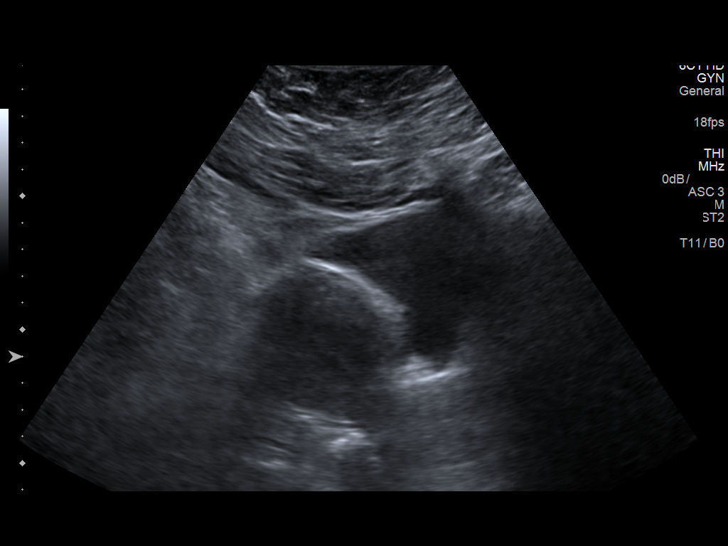
[im 6/68]
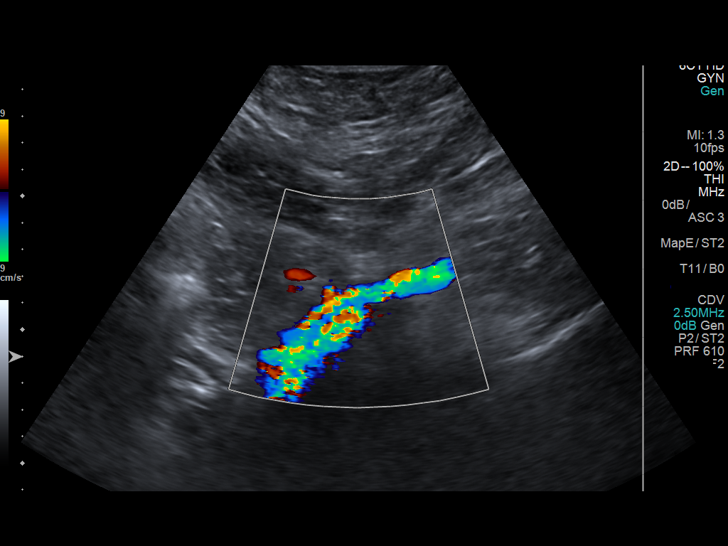
[im 12/68]
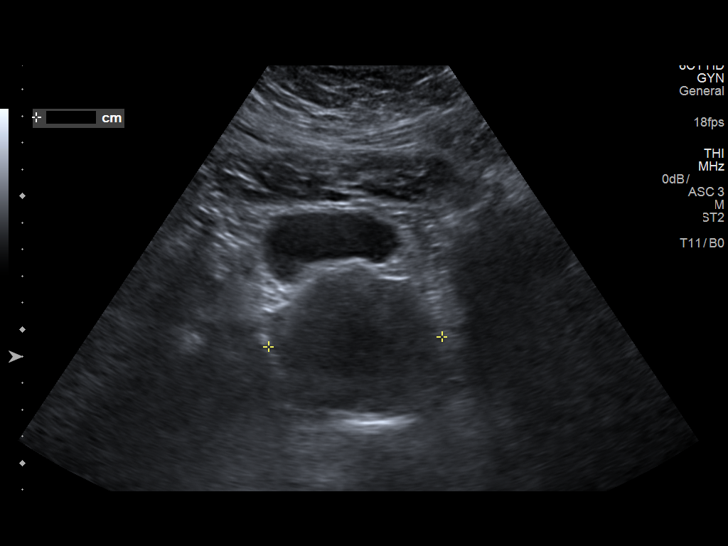
[im 17/68]
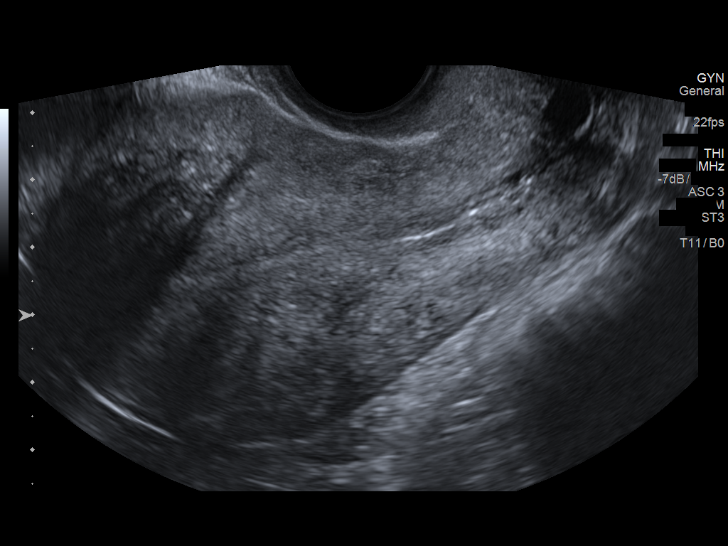
[im 23/68]
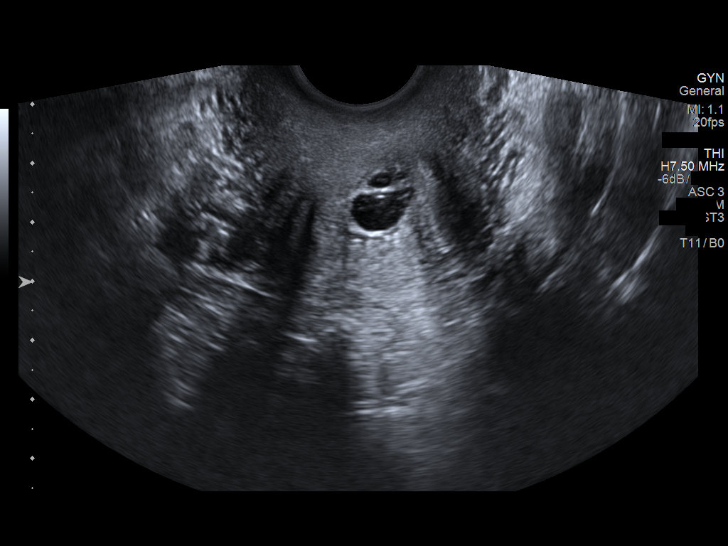
[im 28/68]
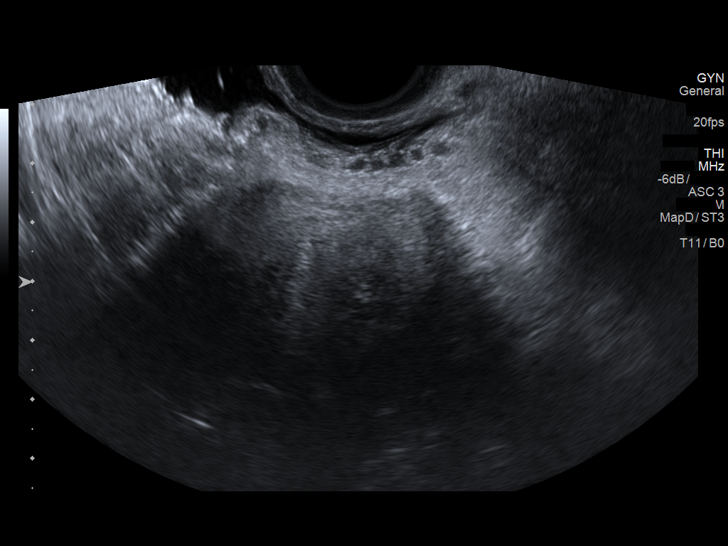
[im 34/68]
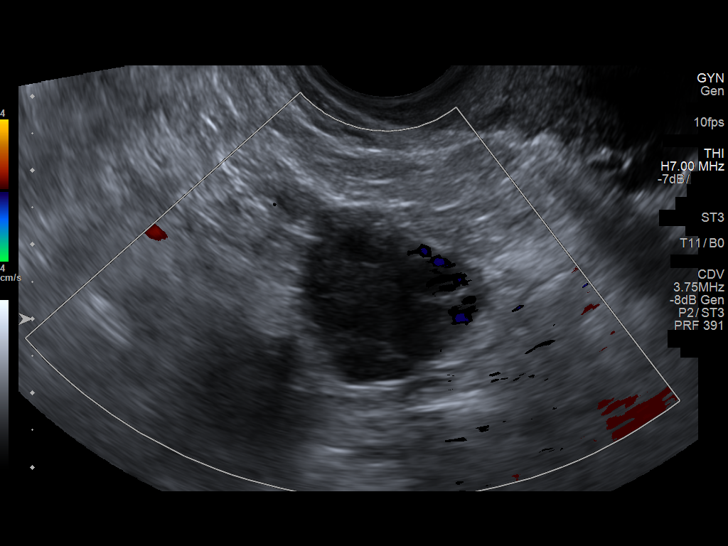
[im 40/68]
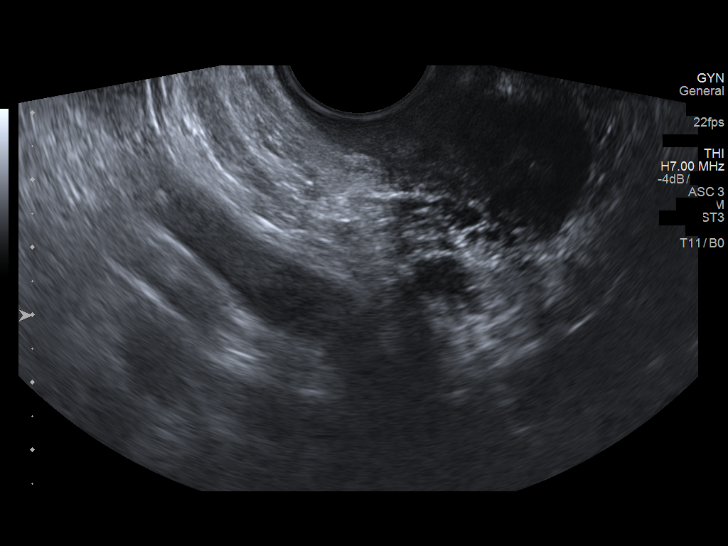
[im 45/68]
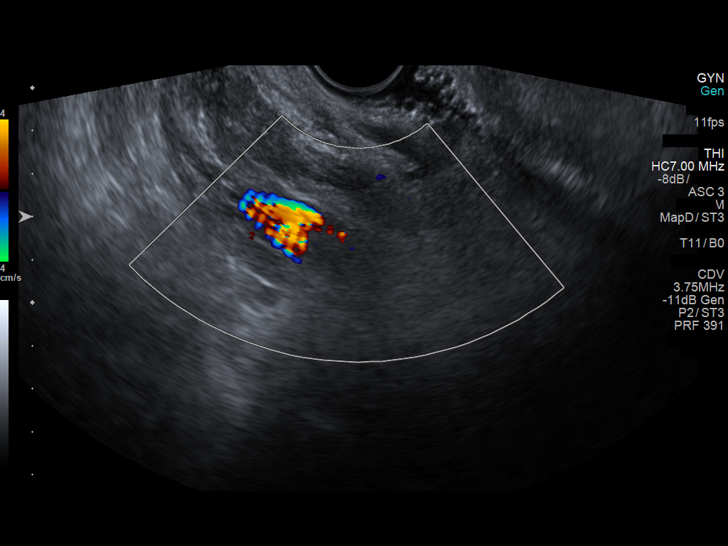
[im 51/68]
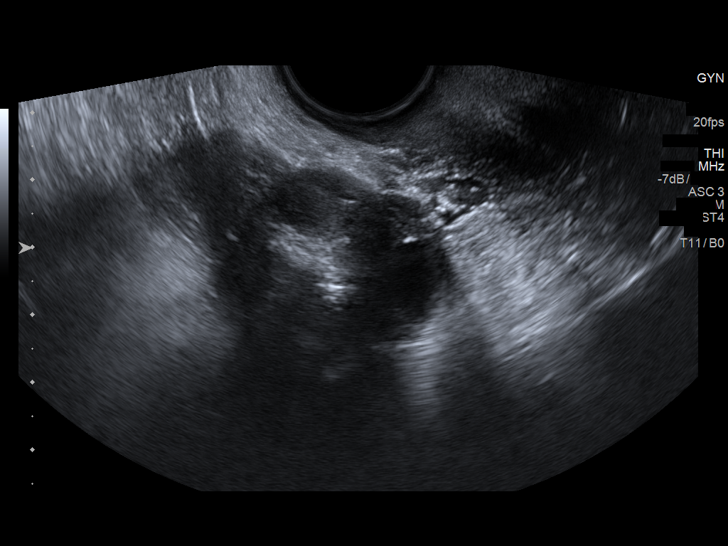
[im 56/68]
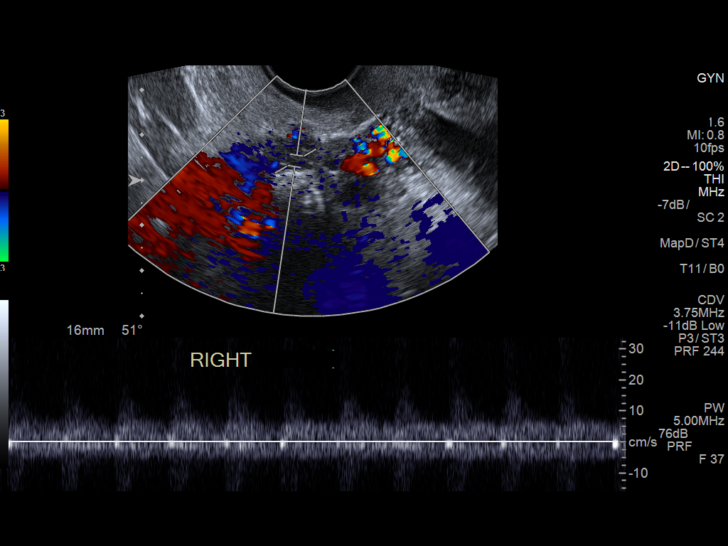
[im 62/68]
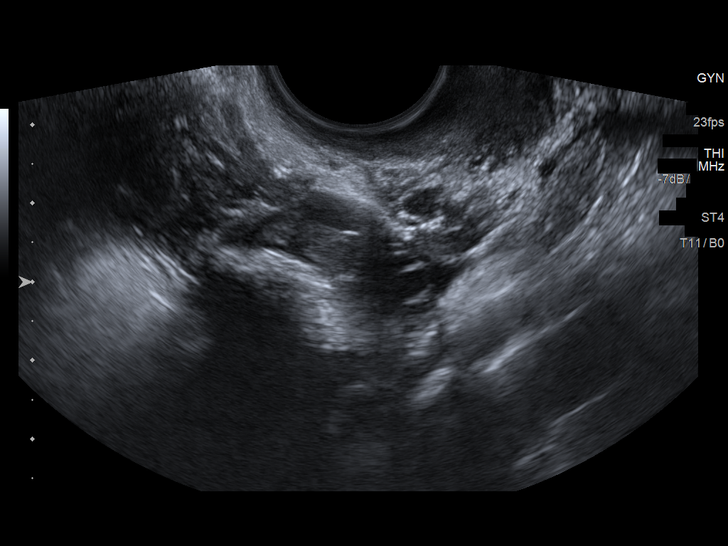
[im 68/68]
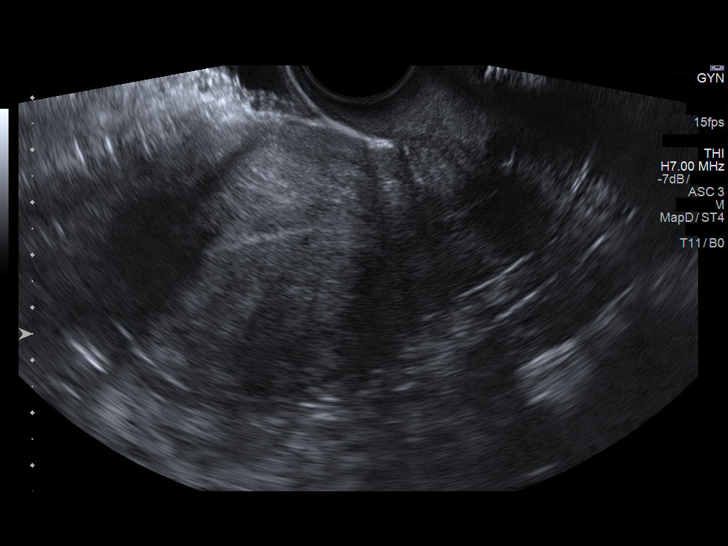

[13 of 25 positions shown; findings below may reference images not displayed]

FINDINGS: Uterus

Measurements: 9.1 x 5.3 x 6.5 cm. No fibroids or other mass
visualized.

Endometrium

Thickness: 4 mm. No focal abnormality visualized.

Right ovary

Measurements: 3.5 x 2.3 x 3.3 cm. Within the right ovary there is a
coarse calcification measuring 2.4 x 0.7 x 1.9 cm. No definite
associated mass.

Left ovary

Measurements: 3.0 x 2.3 x 2.8 cm. Normal appearance/no adnexal mass.

Pulsed Doppler evaluation of both ovaries demonstrates normal
low-resistance arterial and venous waveforms.

Other findings

No abnormal free fluid.
IMPRESSION: No sonographic evidence to suggest ovarian torsion.

There is a 2.4 cm calcification within the right ovary without
definite associated mass, likely dystrophic in etiology. Recommend
follow-up pelvic ultrasound in 3 months to assess for interval
stability.

## 2017-05-28 ENCOUNTER — Encounter: Payer: Self-pay | Admitting: Family Medicine

## 2017-07-09 ENCOUNTER — Ambulatory Visit (INDEPENDENT_AMBULATORY_CARE_PROVIDER_SITE_OTHER): Payer: BLUE CROSS/BLUE SHIELD | Admitting: Podiatry

## 2017-07-09 VITALS — BP 152/94 | HR 86 | Resp 18

## 2017-07-09 DIAGNOSIS — L600 Ingrowing nail: Secondary | ICD-10-CM | POA: Diagnosis not present

## 2017-07-09 MED ORDER — CEPHALEXIN 500 MG PO CAPS: 500.0000 mg | ORAL_CAPSULE | Freq: Three times a day (TID) | ORAL | 0 refills | Status: DC

## 2017-07-09 NOTE — Patient Instructions (Signed)

## 2017-07-12 DIAGNOSIS — L600 Ingrowing nail: Secondary | ICD-10-CM | POA: Insufficient documentation

## 2017-07-12 NOTE — Progress Notes (Signed)
Subjective:    Patient ID: Tracey Anderson, female   DOB: 41 y.o.   MRN: [id number]   HPI [name] presents to the office today for concerns of ingrown toenails the right big toe which is been ongoing for last several months. She states that she's had a take pain medication because the corners are been taking into the skin causing pain. She's been cleaned with peroxide. She denies any pus. She is unsure what her A1c is but according to her chart the last A1c was 8.1 last year. She does take metformin and she says her blood sugars typically runs between 110 the 115. She denies any claudication symptoms. She has no numbness or tingling. She has no other concerns today.   Review of Systems  All other systems reviewed and are negative.  Past Medical History:  Diagnosis Date  . Arthritis   . Environmental allergies   . Gestational diabetes   . Hx gestational diabetes   . Obesity     Past Surgical History:  Procedure Laterality Date  . CHOLECYSTECTOMY  2009  . CYSTECTOMY  2003   left breast     Current Outpatient Medications:  .  cephALEXin (KEFLEX) 500 MG capsule, Take 1 capsule (500 mg total) by mouth 4 (four) times daily., Disp: 28 capsule, Rfl: 0 .  cephALEXin (KEFLEX) 500 MG capsule, Take 1 capsule (500 mg total) by mouth 3 (three) times daily., Disp: 30 capsule, Rfl: 0 .  fexofenadine (ALLEGRA) 180 MG tablet, Take 180 mg by mouth at bedtime., Disp: , Rfl:  .  ibuprofen (ADVIL,MOTRIN) 800 MG tablet, Take 1 tablet (800 mg total) by mouth 3 (three) times daily., Disp: 21 tablet, Rfl: 0 .  meloxicam (MOBIC) 15 MG tablet, Take 15 mg by mouth daily as needed for pain. with food, Disp: , Rfl: 2 .  metFORMIN (GLUCOPHAGE) 500 MG tablet, Take 2 tablets twice daily (Patient taking differently: Take 1,000 mg by mouth 2 (two) times daily with a meal. ), Disp: 360 tablet, Rfl: 3 .  Multiple Vitamin (MULTIVITAMIN) tablet, Take 1 tablet by mouth daily., Disp: , Rfl:  .  naproxen sodium  (ANAPROX) 220 MG tablet, Take 220-440 mg by mouth daily as needed (for pain)., Disp: , Rfl:   No Known Allergies  Social History   Socioeconomic History  . Marital status: Married    Spouse name: Not on file  . Number of children: Not on file  . Years of education: Not on file  . Highest education level: Not on file  Social Needs  . Financial resource strain: Not on file  . Food insecurity - worry: Not on file  . Food insecurity - inability: Not on file  . Transportation needs - medical: Not on file  . Transportation needs - non-medical: Not on file  Occupational History  . Not on file  Tobacco Use  . Smoking status: Never Smoker  . Smokeless tobacco: Never Used  Substance and Sexual Activity  . Alcohol use: No  . Drug use: No  . Sexual activity: Yes    Birth control/protection: None  Other Topics Concern  . Not on file  Social History Narrative   Designated Party Release signed on 01/30/2010        Objective:  Physical Exam General: AAO x3, NAD  Dermatological: There is incurvation present both the medial and lateral aspects the right hallux toenail with tenderness to palpation. Majority of the incurvation along the lateral aspect. There is localized edema  and erythema along the nail corners but there is no ascending cellulitis. There is no fluctuance or crepitus. There is no malodor. There is no drainage or pus expressed today. There is no open lesions or pre-ulcerative lesions identified.  Vascular: Dorsalis Pedis artery and Posterior Tibial artery pedal pulses are 2/4 bilateral with immedate capillary fill time.  There is no pain with calf compression, swelling, warmth, erythema.   Neruologic: Grossly intact via light touch bilateral. Protective threshold with Semmes Wienstein monofilament intact to all pedal sites bilateral.   Musculoskeletal: No gross boney pedal deformities bilateral. No pain, crepitus, or limitation noted with foot and ankle range of motion  bilateral. Muscular strength 5/5 in all groups tested bilateral.  Gait: Unassisted, Nonantalgic.      Assessment:     41 year old female right hallux ingrown toenail    Plan:     -Treatment options discussed including all alternatives, risks, and complications -Etiology of symptoms were discussed -At this time, the patient is requesting partial nail removal with chemical matricectomy to the symptomatic portion of the nail. Risks and complications were discussed with the patient for which they understand and written consent was obtained. Under sterile conditions a total of 3 mL of a mixture of 2% lidocaine plain and 0.5% Marcaine plain was infiltrated in a hallux block fashion. Once anesthetized, the skin was prepped in sterile fashion. A tourniquet was then applied. Next the medial and lateral aspect of hallux nail border was then sharply excised making sure to remove the entire offending nail border. Once the nails were ensured to be removed area was debrided and the underlying skin was intact. There is no purulence identified in the procedure. Next phenol was then applied under standard conditions and copiously irrigated. Silvadene was applied. A dry sterile dressing was applied. After application of the dressing the tourniquet was removed and there is found to be an immediate capillary refill time to the digit. The patient tolerated the procedure well any complications. Post procedure instructions were discussed the patient for which he verbally understood. Follow-up in one week for nail check or sooner if any problems are to arise. Discussed signs/symptoms of infection and directed to call the office immediately should any occur or go directly to the emergency room. In the meantime, encouraged to call the office with any questions, concerns, changes symptoms. -Keflex  Ovid CurdMatthew Wagoner, DPM

## 2017-07-16 ENCOUNTER — Ambulatory Visit (INDEPENDENT_AMBULATORY_CARE_PROVIDER_SITE_OTHER): Payer: Self-pay | Admitting: Podiatry

## 2017-07-16 ENCOUNTER — Encounter: Payer: Self-pay | Admitting: Podiatry

## 2017-07-16 DIAGNOSIS — Z9889 Other specified postprocedural states: Secondary | ICD-10-CM

## 2017-07-16 DIAGNOSIS — L600 Ingrowing nail: Secondary | ICD-10-CM

## 2017-07-16 NOTE — Patient Instructions (Signed)

## 2017-07-16 NOTE — Progress Notes (Signed)
Subjective: Tracey Anderson is a 41 y.o.  female returns to office today for follow up evaluation after having right Hallux medial and lateral nail avulsion performed. Patient has been soaking using epsom salts and applying topical antibiotic covered with bandaid daily. She states that she is doing much better. Only a small amount of clear drainage, no pus. No surrounding redness or red streaks she reports. Patient denies fevers, chills, nausea, vomiting. Denies any calf pain, chest pain, SOB.   Objective:  Vitals: Reviewed  General: Well developed, nourished, in no acute distress, alert and oriented x3   Dermatology: Skin is warm, dry and supple bilateral. Right hallux nail border appears to be clean, dry, with mild granular tissue and surrounding scab. There is no surrounding erythema, edema, drainage/purulence. The remaining nails appear unremarkable at this time. There are no other lesions or other signs of infection present.  Neurovascular status: Intact. No lower extremity swelling; No pain with calf compression bilateral.  Musculoskeletal: Decreased tenderness to palpation of the medial and lateral hallux nail folds. Muscular strength within normal limits bilateral.   Assesement and Plan: S/p partial nail avulsion, doing well.   -Continue soaking in epsom salts twice a day followed by antibiotic ointment and a band-aid. Can leave uncovered at night. Continue this until completely healed.  -If the area has not healed in 2 weeks, call the office for follow-up appointment, or sooner if any problems arise.  -Monitor for any signs/symptoms of infection. Call the office immediately if any occur or go directly to the emergency room. Call with any questions/concerns.  Ovid CurdMatthew Wagoner, DPM

## 2017-12-01 ENCOUNTER — Ambulatory Visit: Payer: Self-pay | Admitting: Family Medicine

## 2017-12-09 ENCOUNTER — Ambulatory Visit (INDEPENDENT_AMBULATORY_CARE_PROVIDER_SITE_OTHER): Payer: BLUE CROSS/BLUE SHIELD | Admitting: Family Medicine

## 2017-12-09 ENCOUNTER — Encounter: Payer: Self-pay | Admitting: Family Medicine

## 2017-12-09 VITALS — BP 120/80 | HR 102 | Temp 97.8°F | Wt 193.2 lb

## 2017-12-09 DIAGNOSIS — Z6836 Body mass index (BMI) 36.0-36.9, adult: Secondary | ICD-10-CM | POA: Diagnosis not present

## 2017-12-09 DIAGNOSIS — E785 Hyperlipidemia, unspecified: Secondary | ICD-10-CM | POA: Insufficient documentation

## 2017-12-09 DIAGNOSIS — E119 Type 2 diabetes mellitus without complications: Secondary | ICD-10-CM

## 2017-12-09 LAB — LIPID PANEL
CHOL/HDL RATIO: 3
CHOLESTEROL: 175 mg/dL (ref 0–200)
HDL: 50.5 mg/dL (ref >39.00)
NonHDL: 124.13
TRIGLYCERIDES: 237 mg/dL — AB (ref 0.0–149.0)
VLDL: 47.4 mg/dL — ABNORMAL HIGH (ref 0.0–40.0)

## 2017-12-09 LAB — BASIC METABOLIC PANEL
BUN: 12 mg/dL (ref 6–23)
CHLORIDE: 102 meq/L (ref 96–112)
CO2: 28 mEq/L (ref 19–32)
CREATININE: 0.53 mg/dL (ref 0.40–1.20)
Calcium: 9.1 mg/dL (ref 8.4–10.5)
GFR: 136.07 mL/min (ref >60.00)
Glucose, Bld: 216 mg/dL — ABNORMAL HIGH (ref 70–99)
Potassium: 4.2 mEq/L (ref 3.5–5.1)
Sodium: 137 mEq/L (ref 135–145)

## 2017-12-09 LAB — HEPATIC FUNCTION PANEL
ALT: 19 U/L (ref 0–35)
AST: 16 U/L (ref 0–37)
Albumin: 4 g/dL (ref 3.5–5.2)
Alkaline Phosphatase: 56 U/L (ref 39–117)
BILIRUBIN DIRECT: 0.1 mg/dL (ref 0.0–0.3)
BILIRUBIN TOTAL: 0.5 mg/dL (ref 0.2–1.2)
Total Protein: 6.9 g/dL (ref 6.0–8.3)

## 2017-12-09 LAB — MICROALBUMIN / CREATININE URINE RATIO
CREATININE, U: 96.7 mg/dL
MICROALB/CREAT RATIO: 0.7 mg/g (ref 0.0–30.0)

## 2017-12-09 LAB — LDL CHOLESTEROL, DIRECT: LDL DIRECT: 119 mg/dL

## 2017-12-09 LAB — POCT GLYCOSYLATED HEMOGLOBIN (HGB A1C): Hemoglobin A1C: 6.8

## 2017-12-09 NOTE — Patient Instructions (Signed)
Try lose a few more pounds Increase exercise with walking and elliptical

## 2017-12-09 NOTE — Progress Notes (Signed)
Subjective:     Patient ID: Tracey Anderson, female   DOB: 1975/12/12, 42 y.o.   MRN: [id number]  HPI Patient here for medical follow-up. She and her husband had lost insurance until recently. She recently has gone back on metformin 500 mg twice daily. She also has history of elevated triglycerides but has not had any lipids done in a couple years. Denies any polyuria or polydipsia. Currently not exercising. Last A1c was 8.1%. She states her daughter was recently diagnosed with possible early type 2 diabetes at age 40. Her husband also has type 2 diabetes  Past Medical History:  Diagnosis Date  . Arthritis   . Environmental allergies   . Gestational diabetes   . Hx gestational diabetes   . Obesity    Past Surgical History:  Procedure Laterality Date  . CHOLECYSTECTOMY  2009  . CYSTECTOMY  2003   left breast    reports that she has never smoked. She has never used smokeless tobacco. She reports that she does not drink alcohol or use drugs. family history includes Diabetes in her maternal grandfather; Heart disease in her paternal grandfather; Hypertension in her maternal grandfather; Stroke in her maternal grandfather. No Active Allergies   Review of Systems  Constitutional: Negative for fatigue.  Eyes: Negative for visual disturbance.  Respiratory: Negative for cough, chest tightness, shortness of breath and wheezing.   Cardiovascular: Negative for chest pain, palpitations and leg swelling.  Endocrine: Negative for polydipsia and polyuria.  Neurological: Negative for dizziness, seizures, syncope, weakness, light-headedness and headaches.       Objective:   Physical Exam  Constitutional: She appears well-developed and well-nourished.  Cardiovascular: Normal rate and regular rhythm.  Pulmonary/Chest: Effort normal and breath sounds normal. No respiratory distress. She has no wheezes. She has no rales.  Musculoskeletal: She exhibits no edema.  Skin:  Feet reveal no skin lesions.  Good distal foot pulses. Good capillary refill. No calluses. Normal sensation with monofilament testing        Assessment:     #1 Type 2 diabetes. Fairly well controlled A1c 6.8%  #2 dyslipidemia.  #3 obesity.    Plan:     -Check labs with lipid panel, basic metabolic panel, hepatic panel -We challenged her to try to lose some additional weight and start more consistent exercise -we discussed diet strategies.  She has current challenge of them living with her parents currently and multiple high carb sources in house. -Routine follow-up in 6 months and recheck A1c then  Kristian CoveyBruce W Burchette MD [facility]  -

## 2017-12-11 ENCOUNTER — Telehealth: Payer: Self-pay | Admitting: Family Medicine

## 2017-12-11 NOTE — Telephone Encounter (Signed)
See result note.  

## 2017-12-11 NOTE — Telephone Encounter (Signed)
Called and left message to call back for lab results and to schedule LFT and lipid panel

## 2017-12-11 NOTE — Telephone Encounter (Signed)
Copied from CRM [id number]. Topic: Quick Communication - Lab Results >> Dec 11, 2017  8:16 AM Trenton GammonVereen, Rachel B, CMA wrote: Called patient to inform them of  lab results. When patient returns call, triage nurse may disclose results.

## 2017-12-11 NOTE — Telephone Encounter (Signed)
Returning call.

## 2017-12-18 ENCOUNTER — Encounter: Payer: Self-pay | Admitting: *Deleted

## 2017-12-29 DIAGNOSIS — E119 Type 2 diabetes mellitus without complications: Secondary | ICD-10-CM | POA: Diagnosis not present

## 2017-12-29 LAB — HM DIABETES EYE EXAM

## 2018-01-08 ENCOUNTER — Encounter: Payer: Self-pay | Admitting: Family Medicine

## 2018-04-06 DIAGNOSIS — Z01419 Encounter for gynecological examination (general) (routine) without abnormal findings: Secondary | ICD-10-CM | POA: Diagnosis not present

## 2018-04-06 DIAGNOSIS — Z6836 Body mass index (BMI) 36.0-36.9, adult: Secondary | ICD-10-CM | POA: Diagnosis not present

## 2018-04-06 DIAGNOSIS — Z1231 Encounter for screening mammogram for malignant neoplasm of breast: Secondary | ICD-10-CM | POA: Diagnosis not present

## 2018-04-08 DIAGNOSIS — N924 Excessive bleeding in the premenopausal period: Secondary | ICD-10-CM | POA: Diagnosis not present

## 2018-06-11 ENCOUNTER — Ambulatory Visit: Payer: Self-pay | Admitting: Family Medicine

## 2018-07-06 DIAGNOSIS — Z23 Encounter for immunization: Secondary | ICD-10-CM | POA: Diagnosis not present

## 2018-10-24 DIAGNOSIS — J019 Acute sinusitis, unspecified: Secondary | ICD-10-CM | POA: Diagnosis not present

## 2019-03-18 ENCOUNTER — Telehealth: Payer: Self-pay

## 2019-03-18 NOTE — Telephone Encounter (Signed)
Called patient and left a detailed message to let her know to schedule a virtual appointment with Dr. Elease Hashimoto.  OK for PEC to discuss and/or advise.  CRM Created.

## 2019-03-23 ENCOUNTER — Telehealth: Payer: Self-pay

## 2019-03-23 NOTE — Telephone Encounter (Signed)
Called patient and left a detailed voice message to have her call our office to schedule an in office visit since she has not been seen since April 2019.  I will send her a MyChart message also.  OK for PEC to discuss and/or advise.  CRM Created.

## 2019-04-15 ENCOUNTER — Telehealth: Payer: Self-pay | Admitting: Family Medicine

## 2019-04-15 NOTE — Telephone Encounter (Signed)
Called patient and scheduled her for in office appointment on 04/19/19 at 2pm. Patient verbalized an understanding.

## 2019-04-15 NOTE — Telephone Encounter (Signed)
Copied from [place] [id number]. Topic: Appointment Scheduling - Scheduling Inquiry for Clinic >> Apr 13, 2019  2:45 PM Tracey Anderson wrote: Reason for CRM: Pt would like to schedule an appt to A1C checked and needs orders/ please advise

## 2019-04-19 ENCOUNTER — Ambulatory Visit (INDEPENDENT_AMBULATORY_CARE_PROVIDER_SITE_OTHER): Payer: BC Managed Care – PPO | Admitting: Family Medicine

## 2019-04-19 ENCOUNTER — Other Ambulatory Visit: Payer: Self-pay

## 2019-04-19 ENCOUNTER — Encounter: Payer: Self-pay | Admitting: Family Medicine

## 2019-04-19 VITALS — BP 142/86 | HR 96 | Temp 98.0°F | Ht 61.1 in | Wt 206.4 lb

## 2019-04-19 DIAGNOSIS — E785 Hyperlipidemia, unspecified: Secondary | ICD-10-CM

## 2019-04-19 DIAGNOSIS — E119 Type 2 diabetes mellitus without complications: Secondary | ICD-10-CM

## 2019-04-19 LAB — BASIC METABOLIC PANEL
BUN: 15 mg/dL (ref 6–23)
CO2: 26 mEq/L (ref 19–32)
Calcium: 9.9 mg/dL (ref 8.4–10.5)
Chloride: 100 mEq/L (ref 96–112)
Creatinine, Ser: 0.58 mg/dL (ref 0.40–1.20)
GFR: 114.59 mL/min (ref >60.00)
Glucose, Bld: 283 mg/dL — ABNORMAL HIGH (ref 70–99)
Potassium: 4.5 mEq/L (ref 3.5–5.1)
Sodium: 136 mEq/L (ref 135–145)

## 2019-04-19 LAB — HEPATIC FUNCTION PANEL
ALT: 46 U/L — ABNORMAL HIGH (ref 0–35)
AST: 33 U/L (ref 0–37)
Albumin: 4.5 g/dL (ref 3.5–5.2)
Alkaline Phosphatase: 65 U/L (ref 39–117)
Bilirubin, Direct: 0.1 mg/dL (ref 0.0–0.3)
Total Bilirubin: 0.5 mg/dL (ref 0.2–1.2)
Total Protein: 7 g/dL (ref 6.0–8.3)

## 2019-04-19 LAB — LIPID PANEL
Cholesterol: 222 mg/dL — ABNORMAL HIGH (ref 0–200)
HDL: 44 mg/dL (ref >39.00)
NonHDL: 178.35
Total CHOL/HDL Ratio: 5
Triglycerides: 387 mg/dL — ABNORMAL HIGH (ref 0.0–149.0)
VLDL: 77.4 mg/dL — ABNORMAL HIGH (ref 0.0–40.0)

## 2019-04-19 LAB — LDL CHOLESTEROL, DIRECT: Direct LDL: 160 mg/dL

## 2019-04-19 LAB — MICROALBUMIN / CREATININE URINE RATIO
Creatinine,U: 89.8 mg/dL
Microalb Creat Ratio: 0.9 mg/g (ref 0.0–30.0)
Microalb, Ur: 0.8 mg/dL (ref 0.0–1.9)

## 2019-04-19 LAB — POCT GLYCOSYLATED HEMOGLOBIN (HGB A1C): Hemoglobin A1C: 11.2 % — AB (ref 4.0–5.6)

## 2019-04-19 MED ORDER — METFORMIN HCL ER 750 MG PO TB24: 750.0000 mg | ORAL_TABLET | Freq: Every day | ORAL | 3 refills | Status: DC

## 2019-04-19 NOTE — Progress Notes (Signed)
Subjective:     Patient ID: Tracey Anderson, female   DOB: [birth year], 43 y.o.   MRN: [id number]  HPI   Follow-up regarding type 2 diabetes.  She has not been seen in over a year.  Basically not taking any medications.  She was on metformin last year but had some GI issues.  When she was on metformin her A1c was 6.8%.  She does not monitor blood sugars currently.  She has had some weight gain.  Denies any blurred vision.  She had hyperlipidemia last year and we recommended pravastatin but she never went on this.  Her husband also has type 2 diabetes which has been poorly controlled.  Very stressful past few months.  They are looking at possible move soon.  She feels a lot of stress with raising 2 children.  No recent eye exam  Past Medical History:  Diagnosis Date  . Arthritis   . Environmental allergies   . Gestational diabetes   . Hx gestational diabetes   . Obesity    Past Surgical History:  Procedure Laterality Date  . CHOLECYSTECTOMY  2009  . CYSTECTOMY  2003   left breast    reports that she has never smoked. She has never used smokeless tobacco. She reports that she does not drink alcohol or use drugs. family history includes Diabetes in her maternal grandfather; Heart disease in her paternal grandfather; Hypertension in her maternal grandfather; Stroke in her maternal grandfather. No Active Allergies   Review of Systems  Constitutional: Negative for chills, fatigue and fever.  Eyes: Negative for visual disturbance.  Respiratory: Negative for cough, chest tightness, shortness of breath and wheezing.   Cardiovascular: Negative for chest pain, palpitations and leg swelling.  Gastrointestinal: Negative for abdominal pain.  Endocrine: Negative for polydipsia and polyuria.  Genitourinary: Negative for dysuria.  Neurological: Negative for dizziness, seizures, syncope, weakness, light-headedness and headaches.       Objective:   Physical Exam Constitutional:    Appearance: She is well-developed.  Eyes:     Pupils: Pupils are equal, round, and reactive to light.  Neck:     Musculoskeletal: Neck supple.     Thyroid: No thyromegaly.     Vascular: No JVD.  Cardiovascular:     Rate and Rhythm: Normal rate and regular rhythm.     Heart sounds: No gallop.   Pulmonary:     Effort: Pulmonary effort is normal. No respiratory distress.     Breath sounds: Normal breath sounds. No wheezing or rales.  Neurological:     Mental Status: She is alert.        Assessment:     #1 type 2 diabetes poorly controlled with poor compliance.  A1c today 11.2%.  Previous GI side effects with immediate release metformin  #2 history of dyslipidemia  #3 elevated blood pressure without prior diagnosis of hypertension.    Plan:     -We will try Metformin extended release 750 mg once daily -Discussed dietary factors.  We would like to her to lose some weight and certainly needs follow-up A1c within 3 months.  She may be a good candidate for SGLT2 or GLP-1 medication as add-on if not getting closer to goal -We discussed statin use and type 2 diabetes.  She would like to check levels first.  We will check lipids today along with basic chemistries -We discussed starting blood pressure medication versus 34-month trial weight loss and reassessing and she would like to try the latter -Set up  diabetic eye exam  Kristian CoveyBruce W Monicka Cyran MD [facility]

## 2019-04-20 ENCOUNTER — Other Ambulatory Visit: Payer: Self-pay

## 2019-04-20 MED ORDER — ATORVASTATIN CALCIUM 20 MG PO TABS: 20.0000 mg | ORAL_TABLET | Freq: Every day | ORAL | 1 refills | Status: DC

## 2019-04-29 ENCOUNTER — Other Ambulatory Visit: Payer: Self-pay

## 2019-04-29 ENCOUNTER — Encounter: Payer: Self-pay | Admitting: Podiatry

## 2019-04-29 ENCOUNTER — Ambulatory Visit (INDEPENDENT_AMBULATORY_CARE_PROVIDER_SITE_OTHER): Payer: BC Managed Care – PPO | Admitting: Podiatry

## 2019-04-29 VITALS — Temp 97.4°F

## 2019-04-29 DIAGNOSIS — L600 Ingrowing nail: Secondary | ICD-10-CM | POA: Diagnosis not present

## 2019-04-29 DIAGNOSIS — L539 Erythematous condition, unspecified: Secondary | ICD-10-CM

## 2019-04-29 MED ORDER — CEPHALEXIN 500 MG PO CAPS: 500.0000 mg | ORAL_CAPSULE | Freq: Three times a day (TID) | ORAL | 0 refills | Status: DC

## 2019-04-29 NOTE — Patient Instructions (Signed)

## 2019-04-29 NOTE — Progress Notes (Signed)
Subjective: 43 year old female presents the office today for concerns of painful ingrown toes of the right medial aspect.  She states that she normally gets pedicures however the nails become very deep and she finds redness and swelling to the nail corner.  No drainage of pus or any redness.  Other nails are starting to become ingrown as well but no drainage or pus.  This is most of the right second toe, medial aspect as well as left hallux.  Is not as symptomatic as the right hallux. Denies any systemic complaints such as fevers, chills, nausea, vomiting. No acute changes since last appointment, and no other complaints at this time.   Her A1c is currently 11.2.  Objective: AAO x3, NAD DP/PT pulses palpable bilaterally, CRT less than 3 seconds On the right medial hallux there is localized edema and erythema to the nail border but no ascending colitis.  No fluctuation crepitation any malodor.  Small moderate erythema to the distal aspect the right medial second toe as well as left hallux.  No drainage or pus and no significant tenderness to palpation.  No open lesions or pre-ulcerative lesions.  No pain with calf compression, swelling, warmth, erythema  Assessment: Right hallux ingrown toenail with localized erythema  Plan: -All treatment options discussed with the patient including all alternatives, risks, complications.  -At this time, recommended partial nail removal without chemical matricectomy to the right medial due to infection. Risks and complications were discussed with the patient for which they understand and  verbally consent to the procedure.  She understands that given her elevated blood sugar she is a high risk of complications.  Under sterile conditions a total of 3 mL of a mixture of 2% lidocaine plain and 0.5% Marcaine plain was infiltrated in a hallux block fashion. Once anesthetized, the skin was prepped in sterile fashion. A tourniquet was then applied. Next the medial border of  the hallux nail border was sharply excised making sure to remove the entire offending nail border. Once the nail was  Removed, the area was debrided and the underlying skin was intact. The area was irrigated and hemostasis was obtained.  A dry sterile dressing was applied. After application of the dressing the tourniquet was removed and there is found to be an immediate capillary refill time to the digit. The patient tolerated the procedure well any complications. Post procedure instructions were discussed the patient for which he verbally understood. Follow-up in one week for nail check or sooner if any problems are to arise. Discussed signs/symptoms of worsening infection and directed to call the office immediately should any occur or go directly to the emergency room. In the meantime, encouraged to call the office with any questions, concerns, changes symptoms. -Keflex -As a courtesy I debrided the other nails but any complications or bleeding to remove the symptomatic portion of ingrown toenail. -Patient encouraged to call the office with any questions, concerns, change in symptoms.   Trula Slade DPM

## 2019-05-04 DIAGNOSIS — N920 Excessive and frequent menstruation with regular cycle: Secondary | ICD-10-CM | POA: Diagnosis not present

## 2019-05-06 ENCOUNTER — Other Ambulatory Visit: Payer: Self-pay

## 2019-05-06 ENCOUNTER — Ambulatory Visit: Payer: BC Managed Care – PPO

## 2019-05-06 ENCOUNTER — Encounter: Payer: Self-pay | Admitting: Family Medicine

## 2019-05-06 DIAGNOSIS — L600 Ingrowing nail: Secondary | ICD-10-CM

## 2019-05-06 NOTE — Patient Instructions (Signed)

## 2019-05-26 ENCOUNTER — Encounter: Payer: Self-pay | Admitting: Family Medicine

## 2019-05-26 DIAGNOSIS — E119 Type 2 diabetes mellitus without complications: Secondary | ICD-10-CM | POA: Diagnosis not present

## 2019-05-26 LAB — HM DIABETES EYE EXAM

## 2019-06-03 NOTE — Progress Notes (Signed)
Patient is here today for follow-up appointment, recent procedure performed on 04/29/2019, removal of ingrown toenail.  She states that she feels like everything is healing well, she is not having any pain, and she continues to soak her toe.  No redness, no swelling, no erythema, no drainage, no other signs symptoms of infection.  The area is scabbing over and healing well at this time.  Discussed signs and symptoms of infection, verbal and written instructions were given to the patient.  She is to follow-up as needed with any acute symptom changes.

## 2019-06-07 DIAGNOSIS — N920 Excessive and frequent menstruation with regular cycle: Secondary | ICD-10-CM | POA: Diagnosis not present

## 2019-06-28 DIAGNOSIS — Z1231 Encounter for screening mammogram for malignant neoplasm of breast: Secondary | ICD-10-CM | POA: Diagnosis not present

## 2019-06-28 DIAGNOSIS — R32 Unspecified urinary incontinence: Secondary | ICD-10-CM | POA: Diagnosis not present

## 2019-06-28 DIAGNOSIS — Z01419 Encounter for gynecological examination (general) (routine) without abnormal findings: Secondary | ICD-10-CM | POA: Diagnosis not present

## 2019-06-28 DIAGNOSIS — Z6838 Body mass index (BMI) 38.0-38.9, adult: Secondary | ICD-10-CM | POA: Diagnosis not present

## 2019-07-20 ENCOUNTER — Ambulatory Visit (INDEPENDENT_AMBULATORY_CARE_PROVIDER_SITE_OTHER): Payer: BC Managed Care – PPO | Admitting: Family Medicine

## 2019-07-20 ENCOUNTER — Encounter: Payer: Self-pay | Admitting: Family Medicine

## 2019-07-20 ENCOUNTER — Other Ambulatory Visit: Payer: Self-pay

## 2019-07-20 VITALS — BP 122/90 | HR 117 | Temp 97.9°F | Ht 61.1 in | Wt 204.5 lb

## 2019-07-20 DIAGNOSIS — E1159 Type 2 diabetes mellitus with other circulatory complications: Secondary | ICD-10-CM | POA: Diagnosis not present

## 2019-07-20 DIAGNOSIS — E1165 Type 2 diabetes mellitus with hyperglycemia: Secondary | ICD-10-CM | POA: Diagnosis not present

## 2019-07-20 DIAGNOSIS — R000 Tachycardia, unspecified: Secondary | ICD-10-CM | POA: Diagnosis not present

## 2019-07-20 DIAGNOSIS — I10 Essential (primary) hypertension: Secondary | ICD-10-CM

## 2019-07-20 DIAGNOSIS — E785 Hyperlipidemia, unspecified: Secondary | ICD-10-CM

## 2019-07-20 MED ORDER — DILTIAZEM HCL ER COATED BEADS 120 MG PO CP24: 120.0000 mg | ORAL_CAPSULE | Freq: Every day | ORAL | 3 refills | Status: DC

## 2019-07-20 MED ORDER — TRULICITY 0.75 MG/0.5ML ~~LOC~~ SOAJ: 0.7500 mg | SUBCUTANEOUS | 11 refills | Status: DC

## 2019-07-20 NOTE — Patient Instructions (Addendum)
Type 2 Diabetes Mellitus, Diagnosis, Adult Type 2 diabetes (type 2 diabetes mellitus) is a long-term (chronic) disease. In type 2 diabetes, one or both of these problems may be present:  The pancreas does not make enough of a hormone called insulin.  Cells in the body do not respond properly to insulin that the body makes (insulin resistance). Normally, insulin allows blood sugar (glucose) to enter cells in the body. The cells use glucose for energy. Insulin resistance or lack of insulin causes excess glucose to build up in the blood instead of going into cells. As a result, high blood glucose (hyperglycemia) develops. What increases the risk? The following factors may make you more likely to develop type 2 diabetes:  Having a family member with type 2 diabetes.  Being overweight or obese.  Having an inactive (sedentary) lifestyle.  Having been diagnosed with insulin resistance.  Having a history of prediabetes, gestational diabetes, or polycystic ovary syndrome (PCOS).  Being of American-Indian, African-American, Hispanic/Latino, or Asian/Pacific Islander descent. What are the signs or symptoms? In the early stage of this condition, you may not have symptoms. Symptoms develop slowly and may include:  Increased thirst (polydipsia).  Increased hunger(polyphagia).  Increased urination (polyuria).  Increased urination during the night (nocturia).  Unexplained weight loss.  Frequent infections that keep coming back (recurring).  Fatigue.  Weakness.  Vision changes, such as blurry vision.  Cuts or bruises that are slow to heal.  Tingling or numbness in the hands or feet.  Dark patches on the skin (acanthosis nigricans). How is this diagnosed? This condition is diagnosed based on your symptoms, your medical history, a physical exam, and your blood glucose level. Your blood glucose may be checked with one or more of the following blood tests:  A fasting blood glucose (FBG)  test. You will not be allowed to eat (you will fast) for 8 hours or longer before a blood sample is taken.  A random blood glucose test. This test checks blood glucose at any time of day regardless of when you ate.  An A1c (hemoglobin A1c) blood test. This test provides information about blood glucose control over the previous 2-3 months.  An oral glucose tolerance test (OGTT). This test measures your blood glucose at two times: ? After fasting. This is your baseline blood glucose level. ? Two hours after drinking a beverage that contains glucose. You may be diagnosed with type 2 diabetes if:  Your FBG level is 126 mg/dL (7.0 mmol/L) or higher.  Your random blood glucose level is 200 mg/dL (11.1 mmol/L) or higher.  Your A1c level is 6.5% or higher.  Your OGTT result is higher than 200 mg/dL (11.1 mmol/L). These blood tests may be repeated to confirm your diagnosis. How is this treated? Your treatment may be managed by a specialist called an endocrinologist. Type 2 diabetes may be treated by following instructions from your health care provider about:  Making diet and lifestyle changes. This may include: ? Following an individualized nutrition plan that is developed by a diet and nutrition specialist (registered dietitian). ? Exercising regularly. ? Finding ways to manage stress.  Checking your blood glucose level as often as told.  Taking diabetes medicines or insulin daily. This helps to keep your blood glucose levels in the healthy range. ? If you use insulin, you may need to adjust the dosage depending on how physically active you are and what foods you eat. Your health care provider will tell you how to adjust your dosage.    Taking medicines to help prevent complications from diabetes, such as: ? Aspirin. ? Medicine to lower cholesterol. ? Medicine to control blood pressure. Your health care provider will set individualized treatment goals for you. Your goals will be based on  your age, other medical conditions you have, and how you respond to diabetes treatment. Generally, the goal of treatment is to maintain the following blood glucose levels:  Before meals (preprandial): 80-130 mg/dL (4.4-7.2 mmol/L).  After meals (postprandial): below 180 mg/dL (10 mmol/L).  A1c level: less than 7%. Follow these instructions at home: Questions to ask your health care provider  Consider asking the following questions: ? Do I need to meet with a diabetes educator? ? Where can I find a support group for people with diabetes? ? What equipment will I need to manage my diabetes at home? ? What diabetes medicines do I need, and when should I take them? ? How often do I need to check my blood glucose? ? What number can I call if I have questions? ? When is my next appointment? General instructions  Take over-the-counter and prescription medicines only as told by your health care provider.  Keep all follow-up visits as told by your health care provider. This is important.  For more information about diabetes, visit: ? American Diabetes Association (ADA): www.diabetes.org ? American Association of Diabetes Educators (AADE): www.diabeteseducator.org Contact a health care provider if:  Your blood glucose is at or above 240 mg/dL (13.3 mmol/L) for 2 days in a row.  You have been sick or have had a fever for 2 days or longer, and you are not getting better.  You have any of the following problems for more than 6 hours: ? You cannot eat or drink. ? You have nausea and vomiting. ? You have diarrhea. Get help right away if:  Your blood glucose is lower than 54 mg/dL (3.0 mmol/L).  You become confused or you have trouble thinking clearly.  You have difficulty breathing.  You have moderate or large ketone levels in your urine. Summary  Type 2 diabetes (type 2 diabetes mellitus) is a long-term (chronic) disease. In type 2 diabetes, the pancreas does not make enough of a  hormone called insulin, or cells in the body do not respond properly to insulin that the body makes (insulin resistance).  This condition is treated by making diet and lifestyle changes and taking diabetes medicines or insulin.  Your health care provider will set individualized treatment goals for you. Your goals will be based on your age, other medical conditions you have, and how you respond to diabetes treatment.  Keep all follow-up visits as told by your health care provider. This is important. This information is not intended to replace advice given to you by your health care provider. Make sure you discuss any questions you have with your health care provider. Document Released: 08/25/2005 Document Revised: 10/23/2017 Document Reviewed: 09/28/2015 Elsevier Patient Education  2020 Elsevier Inc.  

## 2019-07-20 NOTE — Progress Notes (Signed)
Subjective:     Patient ID: Tracey Anderson, female   DOB: Mar 18, 1976, 43 y.o.   MRN: [id number]  HPI   Tracey Anderson is here for follow-up regarding type 2 diabetes and hyperlipidemia.  Refer to recent note.  We started back metformin extended release which she is tolerating well.  Unfortunately her fastings are still running 200-240.  They were over 300.  Recent A1c over 11.  She is compliant with medication.  She had elevated lipids and we started Lipitor 20 mg daily.  Tolerating with no side effects.  No myalgias.  She is dealing with tremendous stress.  A lot of financial stressors.  Husband may be losing one of his jobs.  She is concerned about them losing insurance coverage.  She has elevated pulse today and states that she has noted this in the past some.  No diarrhea.  No tremors.  No weight loss.  Past Medical History:  Diagnosis Date  . Arthritis   . Environmental allergies   . Gestational diabetes   . Hx gestational diabetes   . Obesity    Past Surgical History:  Procedure Laterality Date  . CHOLECYSTECTOMY  2009  . CYSTECTOMY  2003   left breast    reports that she has never smoked. She has never used smokeless tobacco. She reports that she does not drink alcohol or use drugs. family history includes Diabetes in her maternal grandfather; Heart disease in her paternal grandfather; Hypertension in her maternal grandfather; Stroke in her maternal grandfather. Allergies  Allergen Reactions  . Oxycodone Itching     Review of Systems  Constitutional: Negative for fatigue.  Eyes: Negative for visual disturbance.  Respiratory: Negative for cough, chest tightness, shortness of breath and wheezing.   Cardiovascular: Negative for chest pain, palpitations and leg swelling.  Endocrine: Negative for polydipsia and polyuria.  Genitourinary: Negative for dysuria.  Neurological: Negative for dizziness, seizures, syncope, weakness, light-headedness and headaches.       Objective:   Physical Exam Constitutional:      Appearance: She is well-developed.  Eyes:     Pupils: Pupils are equal, round, and reactive to light.  Neck:     Musculoskeletal: Neck supple.     Thyroid: No thyromegaly.     Vascular: No JVD.  Cardiovascular:     Rate and Rhythm: Regular rhythm.     Heart sounds: No gallop.      Comments: Tachycardic with rate around 110 Pulmonary:     Effort: Pulmonary effort is normal. No respiratory distress.     Breath sounds: Normal breath sounds. No wheezing or rales.  Neurological:     Mental Status: She is alert.        Assessment:     #1 type 2 diabetes poorly controlled.  Recent initiation of Metformin.  A1c today only slightly improved at 10.6%  #2 hyperlipidemia with recent initiation of Lipitor.  Needs follow up lipids and hepatic.  #3 elevated blood pressure.  Is been up couple of readings consecutively  #4 elevated resting pulse.  Rule out hyperthyroid- no other overt symptoms of hyperthyroidism.    Plan:     -Check labs today with TSH, lipid panel, hepatic panel -Recommend starting Trulicity 0.75 mg subcu once weekly and we will schedule III month follow-up.  She is familiar with this medication because her husband takes this currently.  She does not have any contraindications such as hx of pancreatitis, FH of medullary thyroid cancer.  -Start Cardizem CD 120 mg  once daily and monitor blood pressure closely.  We chose this because of elevated pulse.  Consider ACE/ARB in future if additional medication needed. -scheduled follow up in 3 months to reassess.  Eulas Post MD  Primary Care at [facility]

## 2019-07-21 DIAGNOSIS — I152 Hypertension secondary to endocrine disorders: Secondary | ICD-10-CM | POA: Insufficient documentation

## 2019-07-21 DIAGNOSIS — E1159 Type 2 diabetes mellitus with other circulatory complications: Secondary | ICD-10-CM | POA: Insufficient documentation

## 2019-07-21 LAB — LIPID PANEL
Cholesterol: 151 mg/dL (ref 0–200)
HDL: 43.3 mg/dL (ref >39.00)
Total CHOL/HDL Ratio: 3
Triglycerides: 469 mg/dL — ABNORMAL HIGH (ref 0.0–149.0)

## 2019-07-21 LAB — HEPATIC FUNCTION PANEL
ALT: 35 U/L (ref 0–35)
AST: 33 U/L (ref 0–37)
Albumin: 4.7 g/dL (ref 3.5–5.2)
Alkaline Phosphatase: 72 U/L (ref 39–117)
Bilirubin, Direct: 0.1 mg/dL (ref 0.0–0.3)
Total Bilirubin: 0.5 mg/dL (ref 0.2–1.2)
Total Protein: 7.4 g/dL (ref 6.0–8.3)

## 2019-07-21 LAB — POCT GLYCOSYLATED HEMOGLOBIN (HGB A1C): HbA1c POC (<> result, manual entry): 10.6 % (ref 4.0–5.6)

## 2019-07-21 LAB — LDL CHOLESTEROL, DIRECT: Direct LDL: 76 mg/dL

## 2019-07-21 LAB — TSH: TSH: 1.43 u[IU]/mL (ref 0.35–4.50)

## 2019-08-17 ENCOUNTER — Telehealth: Payer: Self-pay | Admitting: Family Medicine

## 2019-08-17 ENCOUNTER — Encounter: Payer: Self-pay | Admitting: Family Medicine

## 2019-08-17 NOTE — Telephone Encounter (Signed)
Patient faxed in a physical form  Patient would like for the forms to be emailed to here but we don't have that option.   Please call the patient to pick up forms at: [phone]  Disposition: Dr's Folder

## 2019-08-17 NOTE — Telephone Encounter (Signed)
Forms placed in red folder. Please return when complete. 

## 2019-08-19 ENCOUNTER — Telehealth: Payer: Self-pay

## 2019-08-19 NOTE — Telephone Encounter (Signed)
Called patient and let her know that her forms are complete and she can come pick them up at her convenience. Patient verbalized an understanding.

## 2019-09-22 ENCOUNTER — Telehealth: Payer: Self-pay | Admitting: Family Medicine

## 2019-09-22 NOTE — Telephone Encounter (Signed)
error 

## 2019-10-02 ENCOUNTER — Other Ambulatory Visit: Payer: Self-pay | Admitting: Family Medicine

## 2019-10-21 ENCOUNTER — Encounter: Payer: Self-pay | Admitting: Family Medicine

## 2019-10-21 ENCOUNTER — Ambulatory Visit (INDEPENDENT_AMBULATORY_CARE_PROVIDER_SITE_OTHER): Payer: BC Managed Care – PPO | Admitting: Family Medicine

## 2019-10-21 ENCOUNTER — Other Ambulatory Visit: Payer: Self-pay

## 2019-10-21 VITALS — BP 132/82 | HR 93 | Temp 98.2°F | Ht 61.1 in | Wt 204.9 lb

## 2019-10-21 DIAGNOSIS — I10 Essential (primary) hypertension: Secondary | ICD-10-CM | POA: Diagnosis not present

## 2019-10-21 DIAGNOSIS — E1159 Type 2 diabetes mellitus with other circulatory complications: Secondary | ICD-10-CM | POA: Diagnosis not present

## 2019-10-21 DIAGNOSIS — E785 Hyperlipidemia, unspecified: Secondary | ICD-10-CM | POA: Diagnosis not present

## 2019-10-21 DIAGNOSIS — E1165 Type 2 diabetes mellitus with hyperglycemia: Secondary | ICD-10-CM

## 2019-10-21 LAB — POCT GLYCOSYLATED HEMOGLOBIN (HGB A1C): Hemoglobin A1C: 10.1 % — AB (ref 4.0–5.6)

## 2019-10-21 MED ORDER — OZEMPIC (0.25 OR 0.5 MG/DOSE) 2 MG/1.5ML ~~LOC~~ SOPN: 0.5000 mg | PEN_INJECTOR | SUBCUTANEOUS | 5 refills | Status: DC

## 2019-10-21 NOTE — Progress Notes (Signed)
  Subjective:     Patient ID: Tracey Anderson, female   DOB: [birth year], 44 y.o.   MRN: [id number]  HPI Tracey Anderson is seen for medical follow-up.  She had poorly controlled diabetes last visit.  She is on Metformin and we added Trulicity.  She has some initial nausea but that seems to be improving.  She surprisingly has not lost any weight.  She states she has been very compliant with taking the Trulicity every week.  She remains on Metformin.  Not monitoring blood sugars.  She has dyslipidemia and high triglycerides.  She is on Lipitor and tolerating without side effect.  She had elevated pulse and borderline elevated blood pressure and we added Cardizem low-dose and she is taking that.  No headaches.  No chest pains.  No dizziness.  Past Medical History:  Diagnosis Date  . Arthritis   . Environmental allergies   . Gestational diabetes   . Hx gestational diabetes   . Obesity    Past Surgical History:  Procedure Laterality Date  . CHOLECYSTECTOMY  2009  . CYSTECTOMY  2003   left breast    reports that she has never smoked. She has never used smokeless tobacco. She reports that she does not drink alcohol or use drugs. family history includes Diabetes in her maternal grandfather; Heart disease in her paternal grandfather; Hypertension in her maternal grandfather; Stroke in her maternal grandfather. Allergies  Allergen Reactions  . Oxycodone-Acetaminophen Itching  . Oxycodone Itching     Review of Systems  Constitutional: Negative for unexpected weight change.  Eyes: Negative for visual disturbance.  Respiratory: Negative for cough, chest tightness, shortness of breath and wheezing.   Cardiovascular: Negative for chest pain, palpitations and leg swelling.  Endocrine: Negative for polydipsia and polyuria.  Neurological: Negative for dizziness, seizures, syncope, weakness, light-headedness and headaches.       Objective:   Physical Exam Vitals reviewed.  Constitutional:    Appearance: Normal appearance.  Cardiovascular:     Rate and Rhythm: Normal rate and regular rhythm.  Pulmonary:     Effort: Pulmonary effort is normal.     Breath sounds: Normal breath sounds.  Neurological:     Mental Status: She is alert.        Assessment:     #1 type 2 diabetes.  Surprisingly A1c is unchanged at 10.1%.  She states she is taking the Trulicity regularly but she has not had any weight loss or change in A1c since initiating this.  She appears to be taking this appropriately  #2 hypertension improved after addition of Diltiazem and pulse also improved.   #3 dyslipidemia treated with statin and cholesterol improved.  Triglycerides remained up but should improve as diabetes improves.     Plan:     -Switch from Trulicity to Ozempic 0.5 mg subcutaneous once weekly -We strongly advocated that she consider seeing certified diabetes educator for further nutritional guidance.  She will consider but declines at this time -Strongly counseled lose some weight through more consistent exercise and reducing high glycemic foods -61-month follow-up and if not dramatically improved at that time consider SGLT2 medication and/or insulin -low glycemic diet and increase natural sources of omega 3 with her elevated triglycerides.    Kristian Covey MD [facility] at [facility]

## 2019-10-21 NOTE — Patient Instructions (Signed)

## 2019-11-21 ENCOUNTER — Other Ambulatory Visit: Payer: Self-pay

## 2019-11-21 ENCOUNTER — Encounter: Payer: Self-pay | Admitting: Podiatry

## 2019-11-21 ENCOUNTER — Ambulatory Visit (INDEPENDENT_AMBULATORY_CARE_PROVIDER_SITE_OTHER): Payer: BC Managed Care – PPO | Admitting: Podiatry

## 2019-11-21 VITALS — Temp 96.8°F | Resp 16

## 2019-11-21 DIAGNOSIS — L600 Ingrowing nail: Secondary | ICD-10-CM

## 2019-11-21 DIAGNOSIS — L539 Erythematous condition, unspecified: Secondary | ICD-10-CM | POA: Diagnosis not present

## 2019-11-21 MED ORDER — CEPHALEXIN 500 MG PO CAPS: 500.0000 mg | ORAL_CAPSULE | Freq: Three times a day (TID) | ORAL | 0 refills | Status: DC

## 2019-11-21 NOTE — Patient Instructions (Signed)

## 2019-11-22 ENCOUNTER — Ambulatory Visit: Payer: Self-pay | Admitting: Podiatry

## 2019-11-23 NOTE — Progress Notes (Signed)
Subjective: 44 year old female presents the office today for concerns of ingrown toenail on her left big toe, medial aspect.  She has noticed some swelling and redness to the area but denies any drainage or pus or any red streaks.  She is diabetic and last A1c was 10.1. Denies any systemic complaints such as fevers, chills, nausea, vomiting. No acute changes since last appointment, and no other complaints at this time.   Objective: AAO x3, NAD DP/PT pulses palpable bilaterally, CRT less than 3 seconds Ingrowing present on the left hallux toenail, medial aspect with localized edema and erythema with there is no drainage or pus there is no ascending cellulitis.  There is no fluctuation crepitation. There is no malodor.  Right hallux is healed well from the previous ingrown toenail No open lesions or pre-ulcerative lesions.  No pain with calf compression, swelling, warmth, erythema  Assessment: Left hallux medial nail border ingrown toenail with localized erythema and uncontrolled diabetes  Plan: -All treatment options discussed with the patient including all alternatives, risks, complications.  -At this time, recommended partial nail removal without chemical matricectomy to the left medial due to infection/uncrontrolled blood sugar. Risks and complications were discussed with the patient for which they understand and  verbally consent to the procedure. Under sterile conditions a total of 3 mL of a mixture of 2% lidocaine plain and 0.5% Marcaine plain was infiltrated in a hallux block fashion. Once anesthetized, the skin was prepped in sterile fashion. A tourniquet was then applied. Next the medial border of the hallux nail border was sharply excised making sure to remove the entire offending nail border. Once the nail was  Removed, the area was debrided and the underlying skin was intact. The area was irrigated and hemostasis was obtained.  A dry sterile dressing was applied. After application of the  dressing the tourniquet was removed and there is found to be an immediate capillary refill time to the digit. The patient tolerated the procedure well any complications. Post procedure instructions were discussed the patient for which he verbally understood. Follow-up in one week for nail check or sooner if any problems are to arise. Discussed signs/symptoms of worsening infection and directed to call the office immediately should any occur or go directly to the emergency room. In the meantime, encouraged to call the office with any questions, concerns, changes symptoms. -Keflex -Patient encouraged to call the office with any questions, concerns, change in symptoms.   Vivi Barrack DPM

## 2019-12-06 ENCOUNTER — Ambulatory Visit: Payer: BC Managed Care – PPO | Admitting: Podiatry

## 2020-01-05 ENCOUNTER — Other Ambulatory Visit: Payer: Self-pay

## 2020-01-06 ENCOUNTER — Ambulatory Visit (INDEPENDENT_AMBULATORY_CARE_PROVIDER_SITE_OTHER): Payer: BC Managed Care – PPO | Admitting: Family Medicine

## 2020-01-06 VITALS — BP 132/78 | HR 100 | Temp 98.1°F | Wt 205.7 lb

## 2020-01-06 DIAGNOSIS — L03031 Cellulitis of right toe: Secondary | ICD-10-CM

## 2020-01-06 MED ORDER — CEPHALEXIN 500 MG PO CAPS: 500.0000 mg | ORAL_CAPSULE | Freq: Four times a day (QID) | ORAL | 0 refills | Status: DC

## 2020-01-06 NOTE — Progress Notes (Signed)
  Subjective:     Patient ID: Tracey Anderson, female   DOB: 10/20/1975, 44 y.o.   MRN: [id number]  HPI Cordelia Pen seen with concern for possible infection right great toe.  She has type 2 diabetes with poor control.  A1c recently 10.1%.  Her right great toe has been sore for past couple weeks.  She has had previous problems of the left toe and is seeing podiatrist for that.  Denies any recent toe injury.  She has had some redness involving the distal and medial border.  No change of shoewear.  No fevers or chills.  No known antibiotic allergies.  She has been doing some warm salt water soaks without much improvement.  Regarding her diabetes we were surprised that after we added Trulicity to her Metformin did not see any change significantly in her weight or A1c .  We switched to Ozempic.  She discovered after taking this for several weeks that she was not giving this properly.  She states she is using proper technique now.  She had some minimal nausea first dose or 2 but none since.  She has follow-up in May to reassess her blood sugars  Past Medical History:  Diagnosis Date  . Arthritis   . Environmental allergies   . Gestational diabetes   . Hx gestational diabetes   . Obesity    Past Surgical History:  Procedure Laterality Date  . CHOLECYSTECTOMY  2009  . CYSTECTOMY  2003   left breast    reports that she has never smoked. She has never used smokeless tobacco. She reports that she does not drink alcohol or use drugs. family history includes Diabetes in her maternal grandfather; Heart disease in her paternal grandfather; Hypertension in her maternal grandfather; Stroke in her maternal grandfather. Allergies  Allergen Reactions  . Oxycodone-Acetaminophen Itching  . Oxycodone Itching     Review of Systems  Constitutional: Negative for chills and fever.  Respiratory: Negative for shortness of breath.        Objective:   Physical Exam Vitals reviewed.  Constitutional:    Appearance: Normal appearance.  Cardiovascular:     Rate and Rhythm: Normal rate and regular rhythm.  Pulmonary:     Effort: Pulmonary effort is normal.     Breath sounds: Normal breath sounds.  Skin:    Comments: Right great toe reveals some mild erythema along the medial and distal border.  This is minimally tender to palpation.  No granulation tissue.  She does not have any involvement of redness more proximal toe.  Neurological:     Mental Status: She is alert.        Assessment:     Mild cellulitis right great toe.  No granulation tissue to suggest chronic ingrown toenail.  May have mild ingrown toenail.  She has type 2 diabetes poorly controlled with recent A1c 10.1% with now recent addition of GLP-1 medication    Plan:     -Continue with warm salt water soaks at least twice daily -Keflex 500 mg 4 times daily for 7 days -Follow-up with podiatry for any persistent nail issues -She has follow-up in May and recheck A1c then.  Hopefully will see some big improvements.  If not we will consider addition of SGLT2 medication.  Kristian Covey MD [facility]

## 2020-01-06 NOTE — Patient Instructions (Signed)
Continue with warm salt water soaks to toe at least twice daily

## 2020-01-18 ENCOUNTER — Ambulatory Visit: Payer: BC Managed Care – PPO | Admitting: Family Medicine

## 2020-02-20 ENCOUNTER — Other Ambulatory Visit: Payer: Self-pay

## 2020-02-21 ENCOUNTER — Encounter: Payer: Self-pay | Admitting: Family Medicine

## 2020-02-21 ENCOUNTER — Ambulatory Visit (INDEPENDENT_AMBULATORY_CARE_PROVIDER_SITE_OTHER): Payer: BC Managed Care – PPO | Admitting: Family Medicine

## 2020-02-21 VITALS — BP 128/76 | HR 105 | Temp 98.1°F | Wt 207.5 lb

## 2020-02-21 DIAGNOSIS — L299 Pruritus, unspecified: Secondary | ICD-10-CM

## 2020-02-21 DIAGNOSIS — E1165 Type 2 diabetes mellitus with hyperglycemia: Secondary | ICD-10-CM | POA: Diagnosis not present

## 2020-02-21 LAB — POCT GLYCOSYLATED HEMOGLOBIN (HGB A1C): Hemoglobin A1C: 10.3 % — AB (ref 4.0–5.6)

## 2020-02-21 MED ORDER — EMPAGLIFLOZIN 10 MG PO TABS: 10.0000 mg | ORAL_TABLET | Freq: Every day | ORAL | 5 refills | Status: DC

## 2020-02-21 NOTE — Progress Notes (Signed)
Established Patient Office Visit  Subjective:  Patient ID: Tracey Anderson, female    DOB: 1976/06/08  Age: 44 y.o. MRN: [id number]  CC:  Chief Complaint  Patient presents with  . Follow-up    pt is here for diabetes follow up     HPI [place] presents for follow-up type 2 diabetes.  She had A1c of 10.1% back in February.  She had been on Metformin.  We added Ozempic.  She was instructed to increase after 1 month 0.5 mg but apparently still taking 0.25 mg.  She has not had any improvement in her A1c and in fact her A1c today is 10.3%.  Her weight is also up 3 pounds which we find surprising with recent addition of GLP-1 medication.  She is adamant that she is taking this fairly regularly.  She does not do any regular exercise.  Not monitoring blood sugars regularly.  Her husband also has type 2 diabetes.  He also complains of diffuse itching of both hands.  She does not have her hands in any chemicals.  No associated rash.  Pruritus can be intense at times.  She does not take any medications for this.  No alleviating factors.  No clear exacerbating factors.  Past Medical History:  Diagnosis Date  . Arthritis   . Environmental allergies   . Gestational diabetes   . Hx gestational diabetes   . Obesity     Past Surgical History:  Procedure Laterality Date  . CHOLECYSTECTOMY  2009  . CYSTECTOMY  2003   left breast    Family History  Problem Relation Age of Onset  . Hypertension Maternal Grandfather   . Diabetes Maternal Grandfather   . Stroke Maternal Grandfather   . Heart disease Paternal Grandfather     Social History   Socioeconomic History  . Marital status: Married    Spouse name: Not on file  . Number of children: Not on file  . Years of education: Not on file  . Highest education level: Not on file  Occupational History  . Not on file  Tobacco Use  . Smoking status: Never Smoker  . Smokeless tobacco: Never Used  Vaping Use  . Vaping Use: Never used    Substance and Sexual Activity  . Alcohol use: No  . Drug use: No  . Sexual activity: Yes    Birth control/protection: None  Other Topics Concern  . Not on file  Social History Narrative   Designated Party Release signed on 01/30/2010   Social Determinants of Health   Financial Resource Strain:   . Difficulty of Paying Living Expenses:   Food Insecurity:   . Worried About Charity fundraiser in the Last Year:   . Arboriculturist in the Last Year:   Transportation Needs:   . Film/video editor (Medical):   Marland Kitchen Lack of Transportation (Non-Medical):   Physical Activity:   . Days of Exercise per Week:   . Minutes of Exercise per Session:   Stress:   . Feeling of Stress :   Social Connections:   . Frequency of Communication with Friends and Family:   . Frequency of Social Gatherings with Friends and Family:   . Attends Religious Services:   . Active Member of Clubs or Organizations:   . Attends Archivist Meetings:   Marland Kitchen Marital Status:   Intimate Partner Violence:   . Fear of Current or Ex-Partner:   . Emotionally Abused:   .  Physically Abused:   . Sexually Abused:     Outpatient Medications Prior to Visit  Medication Sig Dispense Refill  . atorvastatin (LIPITOR) 20 MG tablet TAKE 1 TABLET BY MOUTH EVERY DAY 90 tablet 2  . diltiazem (CARDIZEM CD) 120 MG 24 hr capsule Take 1 capsule (120 mg total) by mouth daily. 90 capsule 3  . fexofenadine (ALLEGRA) 180 MG tablet Take 180 mg by mouth at bedtime.    . metFORMIN (GLUCOPHAGE-XR) 750 MG 24 hr tablet Take 1 tablet (750 mg total) by mouth daily with breakfast. 90 tablet 3  . mirabegron ER (MYRBETRIQ) 50 MG TB24 tablet Myrbetriq 50 mg tablet,extended release  Take 1 tablet every day by oral route for 30 days.    . Multiple Vitamin (MULTIVITAMIN) tablet Take 1 tablet by mouth daily.    . Semaglutide,0.25 or 0.5MG /DOS, (OZEMPIC, 0.25 OR 0.5 MG/DOSE,) 2 MG/1.5ML SOPN Inject 0.5 mg into the skin once a week. 1.5 mL 5  .  terconazole (TERAZOL 7) 0.4 % vaginal cream terconazole 0.4 % vaginal cream  INSERT 1 APPLICATOR(S)FUL EVERY DAY VAGINALLY AT BEDTIME FOR 7 DAYS.    . cephALEXin (KEFLEX) 500 MG capsule Take 1 capsule (500 mg total) by mouth 3 (three) times daily. 21 capsule 0  . cephALEXin (KEFLEX) 500 MG capsule Take 1 capsule (500 mg total) by mouth 4 (four) times daily. 28 capsule 0   No facility-administered medications prior to visit.    Allergies  Allergen Reactions  . Oxycodone-Acetaminophen Itching  . Oxycodone Itching    ROS Review of Systems  Constitutional: Negative for fatigue.  Eyes: Negative for visual disturbance.  Respiratory: Negative for cough, chest tightness, shortness of breath and wheezing.   Cardiovascular: Negative for chest pain, palpitations and leg swelling.  Endocrine: Negative for polydipsia and polyuria.  Neurological: Negative for dizziness, seizures, syncope, weakness, light-headedness and headaches.      Objective:    Physical Exam Constitutional:      Appearance: She is well-developed.  Eyes:     Pupils: Pupils are equal, round, and reactive to light.  Neck:     Thyroid: No thyromegaly.     Vascular: No JVD.  Cardiovascular:     Rate and Rhythm: Normal rate and regular rhythm.     Heart sounds: No gallop.   Pulmonary:     Effort: Pulmonary effort is normal. No respiratory distress.     Breath sounds: Normal breath sounds. No wheezing or rales.  Musculoskeletal:     Cervical back: Neck supple.  Skin:    Findings: No rash.  Neurological:     Mental Status: She is alert.     BP 128/76 (BP Location: Left Arm, Patient Position: Sitting, Cuff Size: Normal)   Pulse (!) 105   Temp 98.1 F (36.7 C) (Temporal)   Wt 207 lb 8 oz (94.1 kg)   SpO2 98%   BMI 39.08 kg/m  Wt Readings from Last 3 Encounters:  02/21/20 207 lb 8 oz (94.1 kg)  01/06/20 205 lb 11.2 oz (93.3 kg)  10/21/19 204 lb 14.4 oz (92.9 kg)     Health Maintenance Due  Topic Date Due    . Hepatitis C Screening  Never done  . FOOT EXAM  Never done  . COVID-19 Vaccine (1) Never done  . PAP SMEAR-Modifier  02/14/2018  . URINE MICROALBUMIN  04/18/2020    There are no preventive care reminders to display for this patient.  Lab Results  Component Value Date  TSH 1.43 07/20/2019   Lab Results  Component Value Date   WBC 9.9 01/11/2016   HGB 13.2 01/11/2016   HCT 41.0 01/11/2016   MCV 84.9 01/11/2016   PLT 268 01/11/2016   Lab Results  Component Value Date   NA 136 04/19/2019   K 4.5 04/19/2019   CO2 26 04/19/2019   GLUCOSE 283 (H) 04/19/2019   BUN 15 04/19/2019   CREATININE 0.58 04/19/2019   BILITOT 0.5 07/20/2019   ALKPHOS 72 07/20/2019   AST 33 07/20/2019   ALT 35 07/20/2019   PROT 7.4 07/20/2019   ALBUMIN 4.7 07/20/2019   CALCIUM 9.9 04/19/2019   ANIONGAP 10 01/11/2016   GFR 114.59 04/19/2019   Lab Results  Component Value Date   CHOL 151 07/20/2019   Lab Results  Component Value Date   HDL 43.30 07/20/2019   Lab Results  Component Value Date   LDLCALC (H) 01/15/2010    109        Total Cholesterol/HDL:CHD Risk Coronary Heart Disease Risk Table                     Men   Women  1/2 Average Risk   3.4   3.3  Average Risk       5.0   4.4  2 X Average Risk   9.6   7.1  3 X Average Risk  23.4   11.0        Use the calculated Patient Ratio above and the CHD Risk Table to determine the patient's CHD Risk.        ATP III CLASSIFICATION (LDL):  <100     mg/dL   Optimal  161-096  mg/dL   Near or Above                    Optimal  130-159  mg/dL   Borderline  045-409  mg/dL   High  >811     mg/dL   Very High   Lab Results  Component Value Date   TRIG (H) 07/20/2019    469.0 Triglyceride is over 400; calculations on Lipids are invalid.   Lab Results  Component Value Date   CHOLHDL 3 07/20/2019   Lab Results  Component Value Date   HGBA1C 10.3 (A) 02/21/2020      Assessment & Plan:   #1 uncontrolled type 2 diabetes.  A1c today  10.3%  -Add Jardiance 10 mg once daily.  Reviewed potential side effects -Increase Ozempic to 0.5 mg subcutaneous once weekly and continue Metformin -Increase exercise  #2 bilateral hand pruritus without visible rash. -Recommend trial of Zyrtec or Xyzal over-the-counter and be in touch if not improving  Meds ordered this encounter  Medications  . empagliflozin (JARDIANCE) 10 MG TABS tablet    Sig: Take 1 tablet (10 mg total) by mouth daily before breakfast.    Dispense:  30 tablet    Refill:  5    Follow-up: Return in about 3 months (around 05/23/2020).    Evelena Peat, MD

## 2020-02-21 NOTE — Patient Instructions (Addendum)
Increase the Ozempic to 0.5 mg [place] once weekly  Start the Jardiance 10 mg once daily.    Consider OTC Zyrtec for the itching.

## 2020-03-15 ENCOUNTER — Other Ambulatory Visit: Payer: Self-pay

## 2020-03-15 ENCOUNTER — Ambulatory Visit (INDEPENDENT_AMBULATORY_CARE_PROVIDER_SITE_OTHER): Payer: BC Managed Care – PPO | Admitting: Podiatry

## 2020-03-15 ENCOUNTER — Encounter: Payer: Self-pay | Admitting: Podiatry

## 2020-03-15 DIAGNOSIS — L03031 Cellulitis of right toe: Secondary | ICD-10-CM

## 2020-03-15 NOTE — Patient Instructions (Signed)

## 2020-03-15 NOTE — Progress Notes (Signed)
Subjective:   Patient ID: Tracey Anderson, female   DOB: 44 y.o.   MRN: [id number]   HPI Patient presents with a red right hallux nail and also irritation of the second nail of both feet.  States that it has gotten drainage and that the patient's A1c continues to run over 10 at the current time   ROS      Objective:  Physical Exam  Neurovascular status found to be unchanged with patient having negative Denna Haggard' sign and good digital perfusion.  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  I debrided out the second digit nails bilateral and discussed soaks and for the hallux I recommend removal of the border and proud flesh.  I infiltrated 60 mg like Marcaine mixture sterile prep applied and using sterile instrumentation I remove the medial border all proud flesh abscess tissue and applied sterile dressing and instructed on soaks.  Patient will be seen back to recheck as needed hopefully for permanent procedure at 1 point future

## 2020-04-07 ENCOUNTER — Other Ambulatory Visit: Payer: Self-pay | Admitting: Family Medicine

## 2020-04-07 DIAGNOSIS — E119 Type 2 diabetes mellitus without complications: Secondary | ICD-10-CM

## 2020-04-10 ENCOUNTER — Encounter: Payer: Self-pay | Admitting: Family Medicine

## 2020-04-10 DIAGNOSIS — R05 Cough: Secondary | ICD-10-CM | POA: Diagnosis not present

## 2020-04-10 DIAGNOSIS — M791 Myalgia, unspecified site: Secondary | ICD-10-CM | POA: Diagnosis not present

## 2020-04-10 DIAGNOSIS — Z20828 Contact with and (suspected) exposure to other viral communicable diseases: Secondary | ICD-10-CM | POA: Diagnosis not present

## 2020-04-13 ENCOUNTER — Encounter: Payer: Self-pay | Admitting: Family Medicine

## 2020-04-13 DIAGNOSIS — R0902 Hypoxemia: Secondary | ICD-10-CM | POA: Diagnosis not present

## 2020-04-13 DIAGNOSIS — U071 COVID-19: Secondary | ICD-10-CM | POA: Diagnosis not present

## 2020-04-13 DIAGNOSIS — J9601 Acute respiratory failure with hypoxia: Secondary | ICD-10-CM | POA: Diagnosis not present

## 2020-04-13 DIAGNOSIS — E119 Type 2 diabetes mellitus without complications: Secondary | ICD-10-CM | POA: Diagnosis not present

## 2020-04-13 DIAGNOSIS — I10 Essential (primary) hypertension: Secondary | ICD-10-CM | POA: Diagnosis not present

## 2020-04-13 DIAGNOSIS — R531 Weakness: Secondary | ICD-10-CM | POA: Diagnosis not present

## 2020-04-13 DIAGNOSIS — R4020 Unspecified coma: Secondary | ICD-10-CM | POA: Diagnosis not present

## 2020-04-13 DIAGNOSIS — A419 Sepsis, unspecified organism: Secondary | ICD-10-CM | POA: Diagnosis not present

## 2020-04-13 DIAGNOSIS — A4189 Other specified sepsis: Secondary | ICD-10-CM | POA: Diagnosis not present

## 2020-04-13 DIAGNOSIS — R0600 Dyspnea, unspecified: Secondary | ICD-10-CM | POA: Diagnosis not present

## 2020-04-13 DIAGNOSIS — E785 Hyperlipidemia, unspecified: Secondary | ICD-10-CM | POA: Diagnosis not present

## 2020-04-13 DIAGNOSIS — E876 Hypokalemia: Secondary | ICD-10-CM | POA: Diagnosis not present

## 2020-04-13 DIAGNOSIS — Z8249 Family history of ischemic heart disease and other diseases of the circulatory system: Secondary | ICD-10-CM | POA: Diagnosis not present

## 2020-04-13 DIAGNOSIS — E1165 Type 2 diabetes mellitus with hyperglycemia: Secondary | ICD-10-CM | POA: Diagnosis not present

## 2020-04-13 DIAGNOSIS — R112 Nausea with vomiting, unspecified: Secondary | ICD-10-CM | POA: Diagnosis not present

## 2020-04-13 DIAGNOSIS — R6520 Severe sepsis without septic shock: Secondary | ICD-10-CM | POA: Diagnosis not present

## 2020-04-13 DIAGNOSIS — Z833 Family history of diabetes mellitus: Secondary | ICD-10-CM | POA: Diagnosis not present

## 2020-04-13 DIAGNOSIS — J1282 Pneumonia due to coronavirus disease 2019: Secondary | ICD-10-CM | POA: Diagnosis not present

## 2020-04-13 DIAGNOSIS — Z7984 Long term (current) use of oral hypoglycemic drugs: Secondary | ICD-10-CM | POA: Diagnosis not present

## 2020-04-16 MED ORDER — BENZONATATE 100 MG PO CAPS
100.0000 mg | ORAL_CAPSULE | Freq: Two times a day (BID) | ORAL | 0 refills | Status: DC | PRN
Start: 2020-04-16 — End: 2021-09-13

## 2020-05-09 ENCOUNTER — Other Ambulatory Visit: Payer: Self-pay | Admitting: Family Medicine

## 2020-05-23 ENCOUNTER — Ambulatory Visit: Payer: BC Managed Care – PPO | Admitting: Family Medicine

## 2020-06-14 DIAGNOSIS — E119 Type 2 diabetes mellitus without complications: Secondary | ICD-10-CM | POA: Diagnosis not present

## 2020-06-14 LAB — HM DIABETES EYE EXAM

## 2020-06-29 ENCOUNTER — Encounter: Payer: Self-pay | Admitting: Family Medicine

## 2020-07-08 ENCOUNTER — Other Ambulatory Visit: Payer: Self-pay | Admitting: Family Medicine

## 2020-08-09 ENCOUNTER — Other Ambulatory Visit: Payer: Self-pay | Admitting: Family Medicine

## 2020-09-10 ENCOUNTER — Other Ambulatory Visit: Payer: Self-pay | Admitting: Family Medicine

## 2021-01-13 ENCOUNTER — Other Ambulatory Visit: Payer: Self-pay | Admitting: Family Medicine

## 2021-02-07 DIAGNOSIS — N3281 Overactive bladder: Secondary | ICD-10-CM | POA: Diagnosis not present

## 2021-02-07 DIAGNOSIS — N760 Acute vaginitis: Secondary | ICD-10-CM | POA: Diagnosis not present

## 2021-02-07 DIAGNOSIS — F418 Other specified anxiety disorders: Secondary | ICD-10-CM | POA: Diagnosis not present

## 2021-02-15 ENCOUNTER — Other Ambulatory Visit: Payer: Self-pay | Admitting: Family Medicine

## 2021-03-13 DIAGNOSIS — E785 Hyperlipidemia, unspecified: Secondary | ICD-10-CM | POA: Diagnosis not present

## 2021-03-13 DIAGNOSIS — I10 Essential (primary) hypertension: Secondary | ICD-10-CM | POA: Diagnosis not present

## 2021-03-13 DIAGNOSIS — Z6835 Body mass index (BMI) 35.0-35.9, adult: Secondary | ICD-10-CM | POA: Diagnosis not present

## 2021-03-13 DIAGNOSIS — E1165 Type 2 diabetes mellitus with hyperglycemia: Secondary | ICD-10-CM | POA: Diagnosis not present

## 2021-03-21 DIAGNOSIS — E1165 Type 2 diabetes mellitus with hyperglycemia: Secondary | ICD-10-CM | POA: Diagnosis not present

## 2021-03-21 DIAGNOSIS — E785 Hyperlipidemia, unspecified: Secondary | ICD-10-CM | POA: Diagnosis not present

## 2021-03-21 DIAGNOSIS — I10 Essential (primary) hypertension: Secondary | ICD-10-CM | POA: Diagnosis not present

## 2021-03-27 DIAGNOSIS — I10 Essential (primary) hypertension: Secondary | ICD-10-CM | POA: Diagnosis not present

## 2021-03-27 DIAGNOSIS — E1165 Type 2 diabetes mellitus with hyperglycemia: Secondary | ICD-10-CM | POA: Diagnosis not present

## 2021-03-27 DIAGNOSIS — Z6835 Body mass index (BMI) 35.0-35.9, adult: Secondary | ICD-10-CM | POA: Diagnosis not present

## 2021-03-27 DIAGNOSIS — E785 Hyperlipidemia, unspecified: Secondary | ICD-10-CM | POA: Diagnosis not present

## 2021-04-09 DIAGNOSIS — Z6835 Body mass index (BMI) 35.0-35.9, adult: Secondary | ICD-10-CM | POA: Diagnosis not present

## 2021-04-09 DIAGNOSIS — E1165 Type 2 diabetes mellitus with hyperglycemia: Secondary | ICD-10-CM | POA: Diagnosis not present

## 2021-04-09 DIAGNOSIS — E785 Hyperlipidemia, unspecified: Secondary | ICD-10-CM | POA: Diagnosis not present

## 2021-04-09 DIAGNOSIS — I10 Essential (primary) hypertension: Secondary | ICD-10-CM | POA: Diagnosis not present

## 2021-04-18 ENCOUNTER — Telehealth: Payer: Self-pay

## 2021-04-18 ENCOUNTER — Other Ambulatory Visit: Payer: Self-pay | Admitting: Family Medicine

## 2021-04-18 DIAGNOSIS — E119 Type 2 diabetes mellitus without complications: Secondary | ICD-10-CM

## 2021-04-18 DIAGNOSIS — Z1231 Encounter for screening mammogram for malignant neoplasm of breast: Secondary | ICD-10-CM | POA: Diagnosis not present

## 2021-04-18 DIAGNOSIS — Z6836 Body mass index (BMI) 36.0-36.9, adult: Secondary | ICD-10-CM | POA: Diagnosis not present

## 2021-04-18 DIAGNOSIS — Z01419 Encounter for gynecological examination (general) (routine) without abnormal findings: Secondary | ICD-10-CM | POA: Diagnosis not present

## 2021-04-18 NOTE — Telephone Encounter (Signed)
Called patient left vm, to call and schedule yearly CPE appointment for evaluation.

## 2021-04-18 NOTE — Telephone Encounter (Signed)
Last office visit- 02/20/21 Last refill- 04/09/20-90 tabs, 3 refills  No future office visit is schedule.   Can this patient receive a refill?    Will call patient to schedule yearly appointment.

## 2021-04-19 ENCOUNTER — Other Ambulatory Visit: Payer: Self-pay | Admitting: Family Medicine

## 2021-05-02 ENCOUNTER — Other Ambulatory Visit: Payer: Self-pay | Admitting: Family Medicine

## 2021-05-02 DIAGNOSIS — E119 Type 2 diabetes mellitus without complications: Secondary | ICD-10-CM

## 2021-05-20 ENCOUNTER — Other Ambulatory Visit: Payer: Self-pay | Admitting: Family Medicine

## 2021-06-10 DIAGNOSIS — E119 Type 2 diabetes mellitus without complications: Secondary | ICD-10-CM | POA: Diagnosis not present

## 2021-06-12 ENCOUNTER — Ambulatory Visit (INDEPENDENT_AMBULATORY_CARE_PROVIDER_SITE_OTHER): Payer: BC Managed Care – PPO | Admitting: Family Medicine

## 2021-06-12 ENCOUNTER — Other Ambulatory Visit: Payer: Self-pay

## 2021-06-12 VITALS — BP 140/70 | HR 87 | Temp 97.9°F | Wt 196.1 lb

## 2021-06-12 DIAGNOSIS — I152 Hypertension secondary to endocrine disorders: Secondary | ICD-10-CM

## 2021-06-12 DIAGNOSIS — E1159 Type 2 diabetes mellitus with other circulatory complications: Secondary | ICD-10-CM

## 2021-06-12 DIAGNOSIS — E119 Type 2 diabetes mellitus without complications: Secondary | ICD-10-CM

## 2021-06-12 DIAGNOSIS — E785 Hyperlipidemia, unspecified: Secondary | ICD-10-CM

## 2021-06-12 DIAGNOSIS — E1165 Type 2 diabetes mellitus with hyperglycemia: Secondary | ICD-10-CM

## 2021-06-12 LAB — POCT GLYCOSYLATED HEMOGLOBIN (HGB A1C): Hemoglobin A1C: 9.1 % — AB (ref 4.0–5.6)

## 2021-06-12 MED ORDER — DILTIAZEM HCL ER COATED BEADS 120 MG PO CP24: ORAL_CAPSULE | ORAL | 3 refills | Status: DC

## 2021-06-12 MED ORDER — METFORMIN HCL ER 750 MG PO TB24: 750.0000 mg | ORAL_TABLET | Freq: Every day | ORAL | 3 refills | Status: DC

## 2021-06-12 MED ORDER — TRULICITY 0.75 MG/0.5ML ~~LOC~~ SOAJ: 0.7500 mg | SUBCUTANEOUS | 5 refills | Status: DC

## 2021-06-12 MED ORDER — ATORVASTATIN CALCIUM 20 MG PO TABS: 20.0000 mg | ORAL_TABLET | Freq: Every day | ORAL | 3 refills | Status: DC

## 2021-06-12 NOTE — Patient Instructions (Signed)
Start the Trulicity 0.75 mg [place] once weekly  After one month if fasting sugars still > 130, titrate up to Trulicty 1.5 mg  once weekly.

## 2021-06-12 NOTE — Progress Notes (Signed)
Established Patient Office Visit  Subjective:  Patient ID: Tracey Anderson, female    DOB: 09-May-1976  Age: 45 y.o. MRN: [id number]  CC:  Chief Complaint  Patient presents with   Follow-up    HPI Tracey [place] presents for basically reestablishing her medical follow-up.  She has type 2 diabetes and we had started [place] couple years ago.  She ended up with recurrent yeast vaginitis.  Her gynecologist apparently stopped her Jardiance and it appears she was also referred to endocrinology without our knowledge.  She had been seeing endocrinologist with [facility] and was placed on Ozempic.  This has been backordered and patient had requested another GLP-1 medication and apparently was refused.  At this point she is taking only metformin.  A1c today 9.1%.  She states her A1c was 11.3% just a couple months ago.  She states she had eye exam 2 days ago which was unremarkable.  She also needs refills of atorvastatin, diltiazem, and metformin.  Not clear when her last lipid panel was.  Compliant with medications above.   Past Medical History:  Diagnosis Date   Arthritis    Environmental allergies    Gestational diabetes    Hx gestational diabetes    Obesity     Past Surgical History:  Procedure Laterality Date   CHOLECYSTECTOMY  2009   CYSTECTOMY  2003   left breast    Family History  Problem Relation Age of Onset   Hypertension Maternal Grandfather    Diabetes Maternal Grandfather    Stroke Maternal Grandfather    Heart disease Paternal Grandfather     Social History   Socioeconomic History   Marital status: Married    Spouse name: Not on file   Number of children: Not on file   Years of education: Not on file   Highest education level: Not on file  Occupational History   Not on file  Tobacco Use   Smoking status: Never   Smokeless tobacco: Never  Vaping Use   Vaping Use: Never used  Substance and Sexual Activity   Alcohol use: No   Drug use: No    Sexual activity: Yes    Birth control/protection: None  Other Topics Concern   Not on file  Social History Narrative   Designated Party Release signed on 01/30/2010   Social Determinants of Health   Financial Resource Strain: Not on file  Food Insecurity: Not on file  Transportation Needs: Not on file  Physical Activity: Not on file  Stress: Not on file  Social Connections: Not on file  Intimate Partner Violence: Not on file    Outpatient Medications Prior to Visit  Medication Sig Dispense Refill   atorvastatin (LIPITOR) 20 MG tablet TAKE 1 TABLET BY MOUTH EVERY DAY 90 tablet 0   benzonatate (TESSALON) 100 MG capsule Take 1 capsule (100 mg total) by mouth 2 (two) times daily as needed for cough. 40 capsule 0   diltiazem (CARDIZEM CD) 120 MG 24 hr capsule TAKE 1 CAPSULE BY MOUTH EVERY DAY 90 capsule 0   fexofenadine (ALLEGRA) 180 MG tablet Take 180 mg by mouth at bedtime.     metFORMIN (GLUCOPHAGE-XR) 750 MG 24 hr tablet Take 1 tablet (750 mg total) by mouth daily with breakfast. *appointment required for future refills 30 tablet 0   mirabegron ER (MYRBETRIQ) 50 MG TB24 tablet Myrbetriq 50 mg tablet,extended release  Take 1 tablet every day by oral route for 30 days.  Multiple Vitamin (MULTIVITAMIN) tablet Take 1 tablet by mouth daily.     terconazole (TERAZOL 7) 0.4 % vaginal cream terconazole 0.4 % vaginal cream  INSERT 1 APPLICATOR(S)FUL EVERY DAY VAGINALLY AT BEDTIME FOR 7 DAYS.     doxycycline (VIBRA-TABS) 100 MG tablet Take 100 mg by mouth 2 (two) times daily.     JARDIANCE 10 MG TABS tablet TAKE 1 TABLET (10 MG TOTAL) BY MOUTH DAILY BEFORE BREAKFAST. 90 tablet 0   OZEMPIC, 0.25 OR 0.5 MG/DOSE, 2 MG/1.5ML SOPN INJECT 0.5 MG INTO THE SKIN ONCE A WEEK. 1.5 mL 0   No facility-administered medications prior to visit.    Allergies  Allergen Reactions   Oxycodone-Acetaminophen Itching   Jardiance [Empagliflozin] Other (See Comments)    Yeast vaginitis   Oxycodone Itching     ROS Review of Systems  Constitutional:  Negative for fatigue.  Eyes:  Negative for visual disturbance.  Respiratory:  Negative for cough, chest tightness, shortness of breath and wheezing.   Cardiovascular:  Negative for chest pain, palpitations and leg swelling.  Endocrine: Negative for polydipsia and polyuria.  Neurological:  Negative for dizziness, seizures, syncope, weakness, light-headedness and headaches.     Objective:    Physical Exam Constitutional:      Appearance: She is well-developed.  Eyes:     Pupils: Pupils are equal, round, and reactive to light.  Neck:     Thyroid: No thyromegaly.     Vascular: No JVD.  Cardiovascular:     Rate and Rhythm: Normal rate and regular rhythm.     Heart sounds:    No gallop.  Pulmonary:     Effort: Pulmonary effort is normal. No respiratory distress.     Breath sounds: Normal breath sounds. No wheezing or rales.  Musculoskeletal:     Cervical back: Neck supple.  Neurological:     Mental Status: She is alert.    BP 140/70 (BP Location: Left Arm, Patient Position: Sitting, Cuff Size: Normal)   Pulse 87   Temp 97.9 F (36.6 C) (Oral)   Wt 196 lb 1.6 oz (89 kg)   SpO2 98%   BMI 36.93 kg/m  Wt Readings from Last 3 Encounters:  06/12/21 196 lb 1.6 oz (89 kg)  02/21/20 207 lb 8 oz (94.1 kg)  01/06/20 205 lb 11.2 oz (93.3 kg)     Health Maintenance Due  Topic Date Due   COVID-19 Vaccine (1) Never done   FOOT EXAM  Never done   Hepatitis C Screening  Never done   PAP SMEAR-Modifier  02/14/2018   URINE MICROALBUMIN  04/18/2020   INFLUENZA VACCINE  04/08/2021    There are no preventive care reminders to display for this patient.  Lab Results  Component Value Date   TSH 1.43 07/20/2019   Lab Results  Component Value Date   WBC 9.9 01/11/2016   HGB 13.2 01/11/2016   HCT 41.0 01/11/2016   MCV 84.9 01/11/2016   PLT 268 01/11/2016   Lab Results  Component Value Date   NA 136 04/19/2019   K 4.5 04/19/2019    CO2 26 04/19/2019   GLUCOSE 283 (H) 04/19/2019   BUN 15 04/19/2019   CREATININE 0.58 04/19/2019   BILITOT 0.5 07/20/2019   ALKPHOS 72 07/20/2019   AST 33 07/20/2019   ALT 35 07/20/2019   PROT 7.4 07/20/2019   ALBUMIN 4.7 07/20/2019   CALCIUM 9.9 04/19/2019   ANIONGAP 10 01/11/2016   GFR 114.59 04/19/2019   Lab Results  Component Value Date   CHOL 151 07/20/2019   Lab Results  Component Value Date   HDL 43.30 07/20/2019   Lab Results  Component Value Date   LDLCALC (H) 01/15/2010    109        Total Cholesterol/HDL:CHD Risk Coronary Heart Disease Risk Table                     Men   Women  1/2 Average Risk   3.4   3.3  Average Risk       5.0   4.4  2 X Average Risk   9.6   7.1  3 X Average Risk  23.4   11.0        Use the calculated Patient Ratio above and the CHD Risk Table to determine the patient's CHD Risk.        ATP III CLASSIFICATION (LDL):  <100     mg/dL   Optimal  761-607  mg/dL   Near or Above                    Optimal  130-159  mg/dL   Borderline  371-062  mg/dL   High  >694     mg/dL   Very High   Lab Results  Component Value Date   TRIG (H) 07/20/2019    469.0 Triglyceride is over 400; calculations on Lipids are invalid.   Lab Results  Component Value Date   CHOLHDL 3 07/20/2019   Lab Results  Component Value Date   HGBA1C 9.1 (A) 06/12/2021      Assessment & Plan:   #1 poorly controlled type 2 diabetes.  Patient currently on extended release metformin.  Had been on Ozempic but unable to get this refilled because of national shortage and backorder.  Previous intolerance with Jardiance.  -Start Trulicity 0.75 mg subcu once weekly.  If tolerating well consider further titration to 1.5 mg in 1 month. -27-month office follow-up and recheck A1c  #2 hyperlipidemia. -Refill atorvastatin. -Plan follow-up fasting lipids and hepatic panel at 56-month follow-up.  Would prefer to see better glycemic control before rechecking lipids anyway.  #3  hypertension.  Mildly elevated systolic today. -Refill diltiazem -Work on weight loss and reassess in 3 months.  If not improved at that time consider ARB or ACE inhibitor.   Meds ordered this encounter  Medications   Dulaglutide (TRULICITY) 0.75 MG/0.5ML SOPN    Sig: Inject 0.75 mg into the skin once a week.    Dispense:  2 mL    Refill:  5    Follow-up: Return in about 3 months (around 09/12/2021).    Evelena Peat, MD

## 2021-06-13 ENCOUNTER — Encounter: Payer: Self-pay | Admitting: Family Medicine

## 2021-07-12 ENCOUNTER — Encounter: Payer: Self-pay | Admitting: Podiatry

## 2021-07-12 ENCOUNTER — Other Ambulatory Visit: Payer: Self-pay

## 2021-07-12 ENCOUNTER — Ambulatory Visit (INDEPENDENT_AMBULATORY_CARE_PROVIDER_SITE_OTHER): Payer: BC Managed Care – PPO | Admitting: Podiatry

## 2021-07-12 ENCOUNTER — Ambulatory Visit (INDEPENDENT_AMBULATORY_CARE_PROVIDER_SITE_OTHER): Payer: BC Managed Care – PPO

## 2021-07-12 DIAGNOSIS — L600 Ingrowing nail: Secondary | ICD-10-CM

## 2021-07-12 DIAGNOSIS — S99922A Unspecified injury of left foot, initial encounter: Secondary | ICD-10-CM

## 2021-07-12 NOTE — Patient Instructions (Signed)

## 2021-07-15 NOTE — Progress Notes (Signed)
Subjective:   Patient ID: Tracey Anderson, female   DOB: 45 y.o.   MRN: [id number]   HPI Patient presents stating she has developed a painful right hallux nail in the border medial that is been sore and hard for her to wear shoe gear with comfortably and traumatized her left big toenail and is concerned about the possibility for loss of nail or bony pathology   ROS      Objective:  Physical Exam  Neurovascular status intact with the right showing an incurvated medial border painful when pressed and the left showing mild swelling of the toe discoloration of the nailbed no loss of nail structure or indication of hematoma.  Patient is found to have good digital perfusion well oriented x3      Assessment:  Incurvated right hallux medial border with pain with no indication of infection with a traumatized left hallux possibility for bone injury or nail injury     Plan:  H&P reviewed both conditions and for the left x-ray.  For the right I recommended correction allowed her to read consent form which she signed understanding risk and today I infiltrated the right hallux 60 mg like Marcaine mixture sterile prep done and using sterile instrumentation remove the border exposed matrix applied phenol 3 applications 30 seconds followed by alcohol by sterile dressing gave instructions on soaks to leave dressing on 24 hours but take it off earlier if throbbing were to occur and encouraged her to call with questions concerns.  For the left I do not recommend treatment except for soaks and she does understand she may lose the nail  X-rays indicate there is some trauma to the left hallux no indications of fracture at this time

## 2021-08-29 ENCOUNTER — Emergency Department (HOSPITAL_BASED_OUTPATIENT_CLINIC_OR_DEPARTMENT_OTHER): Payer: BC Managed Care – PPO

## 2021-08-29 ENCOUNTER — Encounter (HOSPITAL_BASED_OUTPATIENT_CLINIC_OR_DEPARTMENT_OTHER): Payer: Self-pay | Admitting: Emergency Medicine

## 2021-08-29 ENCOUNTER — Emergency Department (HOSPITAL_BASED_OUTPATIENT_CLINIC_OR_DEPARTMENT_OTHER)
Admission: EM | Admit: 2021-08-29 | Discharge: 2021-08-29 | Disposition: A | Discharge status: Home / Self Care | Payer: BC Managed Care – PPO | Attending: Emergency Medicine | Admitting: Emergency Medicine

## 2021-08-29 ENCOUNTER — Other Ambulatory Visit: Payer: Self-pay

## 2021-08-29 DIAGNOSIS — I10 Essential (primary) hypertension: Secondary | ICD-10-CM | POA: Diagnosis not present

## 2021-08-29 DIAGNOSIS — Z79899 Other long term (current) drug therapy: Secondary | ICD-10-CM | POA: Diagnosis not present

## 2021-08-29 DIAGNOSIS — N390 Urinary tract infection, site not specified: Secondary | ICD-10-CM | POA: Diagnosis not present

## 2021-08-29 DIAGNOSIS — E119 Type 2 diabetes mellitus without complications: Secondary | ICD-10-CM | POA: Diagnosis not present

## 2021-08-29 DIAGNOSIS — Z7984 Long term (current) use of oral hypoglycemic drugs: Secondary | ICD-10-CM | POA: Insufficient documentation

## 2021-08-29 DIAGNOSIS — K760 Fatty (change of) liver, not elsewhere classified: Secondary | ICD-10-CM | POA: Diagnosis not present

## 2021-08-29 DIAGNOSIS — R319 Hematuria, unspecified: Secondary | ICD-10-CM

## 2021-08-29 DIAGNOSIS — R109 Unspecified abdominal pain: Secondary | ICD-10-CM | POA: Diagnosis not present

## 2021-08-29 LAB — CBC WITH DIFFERENTIAL/PLATELET
Abs Immature Granulocytes: 0.08 10*3/uL — ABNORMAL HIGH (ref 0.00–0.07)
Basophils Absolute: 0.1 10*3/uL (ref 0.0–0.1)
Basophils Relative: 0 %
Eosinophils Absolute: 0.1 10*3/uL (ref 0.0–0.5)
Eosinophils Relative: 1 %
HCT: 42.2 % (ref 36.0–46.0)
Hemoglobin: 14.1 g/dL (ref 12.0–15.0)
Immature Granulocytes: 1 %
Lymphocytes Relative: 5 %
Lymphs Abs: 1 10*3/uL (ref 0.7–4.0)
MCH: 28.4 pg (ref 26.0–34.0)
MCHC: 33.4 g/dL (ref 30.0–36.0)
MCV: 84.9 fL (ref 80.0–100.0)
Monocytes Absolute: 1.1 10*3/uL — ABNORMAL HIGH (ref 0.1–1.0)
Monocytes Relative: 6 %
Neutro Abs: 15.2 10*3/uL — ABNORMAL HIGH (ref 1.7–7.7)
Neutrophils Relative %: 87 %
Platelets: 243 10*3/uL (ref 150–400)
RBC: 4.97 MIL/uL (ref 3.87–5.11)
RDW: 12.3 % (ref 11.5–15.5)
WBC: 17.5 10*3/uL — ABNORMAL HIGH (ref 4.0–10.5)
nRBC: 0 % (ref 0.0–0.2)

## 2021-08-29 LAB — COMPREHENSIVE METABOLIC PANEL
ALT: 24 U/L (ref 0–44)
AST: 22 U/L (ref 15–41)
Albumin: 4.1 g/dL (ref 3.5–5.0)
Alkaline Phosphatase: 74 U/L (ref 38–126)
Anion gap: 13 (ref 5–15)
BUN: 16 mg/dL (ref 6–20)
CO2: 18 mmol/L — ABNORMAL LOW (ref 22–32)
Calcium: 9.1 mg/dL (ref 8.9–10.3)
Chloride: 102 mmol/L (ref 98–111)
Creatinine, Ser: 0.5 mg/dL (ref 0.44–1.00)
GFR, Estimated: >60 mL/min (ref >60)
Glucose, Bld: 309 mg/dL — ABNORMAL HIGH (ref 70–99)
Potassium: 3.9 mmol/L (ref 3.5–5.1)
Sodium: 133 mmol/L — ABNORMAL LOW (ref 135–145)
Total Bilirubin: 1.3 mg/dL — ABNORMAL HIGH (ref 0.3–1.2)
Total Protein: 7.9 g/dL (ref 6.5–8.1)

## 2021-08-29 LAB — URINALYSIS, MICROSCOPIC (REFLEX): WBC, UA: >50 WBC/hpf (ref 0–5)

## 2021-08-29 LAB — URINALYSIS, ROUTINE W REFLEX MICROSCOPIC
Bilirubin Urine: NEGATIVE
Glucose, UA: >=500 mg/dL — AB
Ketones, ur: 80 mg/dL — AB
Nitrite: POSITIVE — AB
Protein, ur: 100 mg/dL — AB
Specific Gravity, Urine: 1.025 (ref 1.005–1.030)
pH: 5.5 (ref 5.0–8.0)

## 2021-08-29 LAB — PREGNANCY, URINE: Preg Test, Ur: NEGATIVE

## 2021-08-29 MED ORDER — SODIUM CHLORIDE 0.9 % IV SOLN
2.0000 g | Freq: Once | INTRAVENOUS | Status: AC
  Administered 2021-08-29: 08:00:00 2 g via INTRAVENOUS
  Filled 2021-08-29: qty 20

## 2021-08-29 MED ORDER — SODIUM CHLORIDE 0.9 % IV SOLN: INTRAVENOUS | Status: DC | PRN

## 2021-08-29 MED ORDER — IOHEXOL 300 MG/ML  SOLN
100.0000 mL | Freq: Once | INTRAMUSCULAR | Status: AC | PRN
  Administered 2021-08-29: 09:00:00 100 mL via INTRAVENOUS

## 2021-08-29 MED ORDER — DILTIAZEM HCL ER COATED BEADS 120 MG PO CP24
120.0000 mg | ORAL_CAPSULE | Freq: Every day | ORAL | Status: DC
  Administered 2021-08-29: 08:00:00 120 mg via ORAL
  Filled 2021-08-29: qty 1

## 2021-08-29 MED ORDER — SODIUM CHLORIDE 0.9 % IV BOLUS
1000.0000 mL | Freq: Once | INTRAVENOUS | Status: AC
  Administered 2021-08-29: 07:00:00 1000 mL via INTRAVENOUS

## 2021-08-29 MED ORDER — ONDANSETRON HCL 4 MG/2ML IJ SOLN
4.0000 mg | Freq: Once | INTRAMUSCULAR | Status: AC
  Administered 2021-08-29: 08:00:00 4 mg via INTRAVENOUS
  Filled 2021-08-29: qty 2

## 2021-08-29 MED ORDER — FLUCONAZOLE 200 MG PO TABS: 200.0000 mg | ORAL_TABLET | Freq: Once | ORAL | 0 refills | Status: AC

## 2021-08-29 MED ORDER — KETOROLAC TROMETHAMINE 30 MG/ML IJ SOLN
30.0000 mg | Freq: Once | INTRAMUSCULAR | Status: AC
  Administered 2021-08-29: 08:00:00 30 mg via INTRAVENOUS
  Filled 2021-08-29: qty 1

## 2021-08-29 MED ORDER — CEPHALEXIN 500 MG PO CAPS: 500.0000 mg | ORAL_CAPSULE | Freq: Three times a day (TID) | ORAL | 0 refills | Status: AC

## 2021-08-29 NOTE — Discharge Instructions (Signed)
Your urine test and blood work showed that you have a bad urine infection.  We gave you IV antibiotics in the ER, and I prescribed oral antibiotics to take at home.  Your next dose would be due tonight (or tomorrow morning if you cannot get to the pharmacy today).   I also prescribed you fluconazole, which is a one-time medication that can be used for yeast infections.  We talked about your blood sugars which are high today, around 300.  Please talk about this with your primary care doctor.  They may need to make adjustments to your diabetes medications.  You should continue taking all of your other usual medications at home.  We did give you the diltiazem this morning for your blood pressure and fast heart rate.

## 2021-08-29 NOTE — ED Triage Notes (Signed)
Pt c/o urinary frequency, right sided lower back pain, and nausea. Pt states she also c/o vaginal yeast infection.

## 2021-08-29 NOTE — ED Provider Notes (Signed)
[facility] Provider Note   CSN: [id number] Arrival date & time: 08/29/21  [id number]     History Chief Complaint  Patient presents with   Urinary Tract Infection    [place] is a 45 y.o. female presenting to the ED with right flank pain and urinary frequency, gradual onset yesterday.  Right lower flank pain, does not travel, feels like her kidney stones "years ago," with frequent urination and some vaginal itching.  She reports her blood sugars may be "high."  Reports nausea and dry heaving today.  Hx of cholecystectomy  Hx of resting tachycardia, takes diltiazem usually, did not take morning med  HPI     Past Medical History:  Diagnosis Date   Arthritis    Environmental allergies    Gestational diabetes    Hx gestational diabetes    Obesity     Patient Active Problem List   Diagnosis Date Noted   Hypertension associated with diabetes ([place]) 07/21/2019   Dyslipidemia 12/09/2017   Ingrown toenail 07/12/2017   Daytime somnolence 04/01/2016   Uncontrolled type 2 diabetes mellitus with hyperglycemia ([place]) 09/21/2015   DEPRESSION 02/19/2010   ANXIETY STATE, UNSPECIFIED 01/30/2010   COUGH, CHRONIC 01/30/2010   OBESITY 01/21/2010   PARESTHESIA 01/21/2010   HEADACHE 01/21/2010   URINARY INCONTINENCE 01/21/2010    Past Surgical History:  Procedure Laterality Date   CHOLECYSTECTOMY  2009   CYSTECTOMY  2003   left breast     OB History     Gravida  2   Para  2   Term  1   Preterm  1   AB  0   Living  2      SAB      IAB      Ectopic      Multiple      Live Births  1           Family History  Problem Relation Age of Onset   Hypertension Maternal Grandfather    Diabetes Maternal Grandfather    Stroke Maternal Grandfather    Heart disease Paternal Grandfather     Social History   Tobacco Use   Smoking status: Never   Smokeless tobacco: Never  Vaping Use   Vaping Use: Never used  Substance Use  Topics   Alcohol use: No   Drug use: No    Home Medications Prior to Admission medications   Medication Sig Start Date End Date Taking? Authorizing Provider  cephALEXin (KEFLEX) 500 MG capsule Take 1 capsule (500 mg total) by mouth 3 (three) times daily for 7 days. 08/30/21 09/06/21 Yes Jaylani Mcguinn, Carola Rhine, MD  fluconazole (DIFLUCAN) 200 MG tablet Take 1 tablet (200 mg total) by mouth once for 1 dose. 08/29/21 08/29/21 Yes Vester Balthazor, Carola Rhine, MD  atorvastatin (LIPITOR) 20 MG tablet Take 1 tablet (20 mg total) by mouth daily. 06/12/21   Burchette, Alinda Sierras, MD  benzonatate (TESSALON) 100 MG capsule Take 1 capsule (100 mg total) by mouth 2 (two) times daily as needed for cough. 04/16/20   Burchette, Alinda Sierras, MD  diltiazem (CARDIZEM CD) 120 MG 24 hr capsule TAKE 1 CAPSULE BY MOUTH EVERY DAY 06/12/21   Burchette, Alinda Sierras, MD  Dulaglutide (TRULICITY) A999333 0000000 SOPN Inject 0.75 mg into the skin once a week. 06/12/21   Burchette, Alinda Sierras, MD  fexofenadine (ALLEGRA) 180 MG tablet Take 180 mg by mouth at bedtime.    [provider]  metFORMIN (GLUCOPHAGE-XR) 750  MG 24 hr tablet Take 1 tablet (750 mg total) by mouth daily with breakfast. *appointment required for future refills 06/12/21   Burchette, Elberta Fortis, MD  mirabegron ER (MYRBETRIQ) 50 MG TB24 tablet Myrbetriq 50 mg tablet,extended release  Take 1 tablet every day by oral route for 30 days.    [provider]  Multiple Vitamin (MULTIVITAMIN) tablet Take 1 tablet by mouth daily.    [provider]  terconazole (TERAZOL 7) 0.4 % vaginal cream terconazole 0.4 % vaginal cream  INSERT 1 APPLICATOR(S)FUL EVERY DAY VAGINALLY AT BEDTIME FOR 7 DAYS.    [provider]    Allergies    Oxycodone-acetaminophen, Jardiance [empagliflozin], and Oxycodone  Review of Systems   Review of Systems  Constitutional:  Negative for chills and fever.  Respiratory:  Negative for cough and shortness of breath.   Cardiovascular:  Negative  for chest pain and palpitations.  Gastrointestinal:  Positive for abdominal pain, nausea and vomiting.  Genitourinary:  Positive for flank pain and frequency.  Musculoskeletal:  Negative for arthralgias and back pain.  Skin:  Negative for color change and rash.  Neurological:  Negative for syncope and headaches.  All other systems reviewed and are negative.  Physical Exam Updated Vital Signs BP 118/67    Pulse (!) 114    Temp 98.4 F (36.9 C) (Oral)    Resp 18    Ht 5\' 1"  (1.549 m)    Wt 87.1 kg    LMP 08/01/2021 (Exact Date) Comment: neg upreg in er today.   SpO2 95%    BMI 36.28 kg/m   Physical Exam Constitutional:      General: She is not in acute distress. HENT:     Head: Normocephalic and atraumatic.  Eyes:     Conjunctiva/sclera: Conjunctivae normal.     Pupils: Pupils are equal, round, and reactive to light.  Cardiovascular:     Rate and Rhythm: Normal rate and regular rhythm.     Pulses: Normal pulses.  Pulmonary:     Effort: Pulmonary effort is normal. No respiratory distress.  Abdominal:     General: There is no distension.     Tenderness: There is no abdominal tenderness. There is no right CVA tenderness or left CVA tenderness.  Skin:    General: Skin is warm and dry.  Neurological:     General: No focal deficit present.     Mental Status: She is alert and oriented to person, place, and time. Mental status is at baseline.  Psychiatric:        Mood and Affect: Mood normal.        Behavior: Behavior normal.    ED Results / Procedures / Treatments   Labs (all labs ordered are listed, but only abnormal results are displayed) Labs Reviewed  URINALYSIS, ROUTINE W REFLEX MICROSCOPIC - Abnormal; Notable for the following components:      Result Value   APPearance CLOUDY (*)    Glucose, UA >=500 (*)    Hgb urine dipstick LARGE (*)    Ketones, ur 80 (*)    Protein, ur 100 (*)    Nitrite POSITIVE (*)    Leukocytes,Ua MODERATE (*)    All other components within  normal limits  CBC WITH DIFFERENTIAL/PLATELET - Abnormal; Notable for the following components:   WBC 17.5 (*)    Neutro Abs 15.2 (*)    Monocytes Absolute 1.1 (*)    Abs Immature Granulocytes 0.08 (*)    All other components  within normal limits  COMPREHENSIVE METABOLIC PANEL - Abnormal; Notable for the following components:   Sodium 133 (*)    CO2 18 (*)    Glucose, Bld 309 (*)    Total Bilirubin 1.3 (*)    All other components within normal limits  URINALYSIS, MICROSCOPIC (REFLEX) - Abnormal; Notable for the following components:   Bacteria, UA MANY (*)    Non Squamous Epithelial PRESENT (*)    All other components within normal limits  URINE CULTURE  PREGNANCY, URINE    EKG None  Radiology CT ABDOMEN PELVIS W CONTRAST  Result Date: 08/29/2021 CLINICAL DATA:  Right flank pain for 2 days, urinary frequency, low back pain, nausea EXAM: CT ABDOMEN AND PELVIS WITH CONTRAST TECHNIQUE: Multidetector CT imaging of the abdomen and pelvis was performed using the standard protocol following bolus administration of intravenous contrast. CONTRAST:  114mL OMNIPAQUE IOHEXOL 300 MG/ML  SOLN COMPARISON:  01/11/2016 FINDINGS: Lower chest: No acute abnormality. Hepatobiliary: Diffuse hypoattenuation of the liver compatible with hepatic steatosis, improved compared to 2017. No large focal hepatic abnormality or biliary dilatation. Remote cholecystectomy. Common bile duct nondilated. Pancreas: Unremarkable. No pancreatic ductal dilatation or surrounding inflammatory changes. Spleen: Normal in size without focal abnormality. Adrenals/Urinary Tract: Normal adrenal glands. Tiny punctate nonobstructing left intrarenal calculi noted in the lower pole. No current renal obstruction, hydronephrosis, hydroureter or obstructing ureteral calculus. Ureters are symmetric and decompressed. Bladder collapsed. Stomach/Bowel: Stomach is within normal limits. Appendix appears normal. No evidence of bowel wall thickening,  distention, or inflammatory changes. Vascular/Lymphatic: No significant vascular findings are present. No enlarged abdominal or pelvic lymph nodes. Reproductive: Uterus normal in size and anteverted. Ovaries are normal in size. Dystrophic calcification noted of the right ovary as before. Tubal occlusion devices present bilaterally. No pelvic free fluid, fluid collection, hemorrhage, hematoma, or abscess. Other: Slightly larger fat containing ventral hernia above the umbilicus. No protruding bowel or incarceration. No obstruction pattern. Musculoskeletal: No acute osseous finding. IMPRESSION: No acute intra-abdominal or pelvic finding. Interval improvement in the hepatic steatosis. Remote cholecystectomy Slightly larger fat containing midline ventral hernia. No other complicating feature. Punctate tiny nonobstructing left nephrolithiasis. Electronically Signed   By: Jerilynn Mages.  Shick M.D.   On: 08/29/2021 09:02    Procedures Procedures   Medications Ordered in ED Medications  diltiazem (CARDIZEM CD) 24 hr capsule 120 mg (120 mg Oral Given 08/29/21 0743)  0.9 %  sodium chloride infusion ( Intravenous Stopped 08/29/21 0826)  sodium chloride 0.9 % bolus 1,000 mL ( Intravenous Stopped 08/29/21 0750)  ketorolac (TORADOL) 30 MG/ML injection 30 mg (30 mg Intravenous Given 08/29/21 0742)  ondansetron (ZOFRAN) injection 4 mg (4 mg Intravenous Given 08/29/21 0741)  cefTRIAXone (ROCEPHIN) 2 g in sodium chloride 0.9 % 100 mL IVPB (0 g Intravenous Stopped 08/29/21 0818)  iohexol (OMNIPAQUE) 300 MG/ML solution 100 mL (100 mLs Intravenous Contrast Given 08/29/21 0831)    ED Course  I have reviewed the triage vital signs and the nursing notes.  Pertinent labs & imaging results that were available during my care of the patient were reviewed by me and considered in my medical decision making (see chart for details).  Ddx includes ureteral colic vs UTI vs other No RLQ ttp to suggest appendicitis Doubt ovarian  torsion  Labs reviewed - WBC elevated at 17.5, UA +Nitrites, [place], concerning for UTI.   Will give rocephin, order CT with contrast to evaluate for renal stone vs pyelonephritis  Clinical Course as of 08/29/21 1005  Thu Aug 29, 2021  0745 Rocephin ordered, CT abdomen as well.  Pt updated regarding results thus far [MT]  0918   IMPRESSION: No acute intra-abdominal or pelvic finding.   Interval improvement in the hepatic steatosis.   Remote cholecystectomy   Slightly larger fat containing midline ventral hernia. No other complicating feature.   Punctate tiny nonobstructing left nephrolithiasis.  [MT]  [phone] Suspect this is likely UTI - pt remains stable here, feeling overall well.  I think outpatient antibiotic management is reasonable.  Lower suspicion for sepsis clinically at this time.  No evident retained urinary stone or ureteral stone, no clear signs of pyelo on CT imaging or per exam.  [MT]    Clinical Course User Index [MT] Wyvonnia Dusky, MD    Final Clinical Impression(s) / ED Diagnoses Final diagnoses:  Urinary tract infection with hematuria, site unspecified    Rx / DC Orders ED Discharge Orders          Ordered    cephALEXin (KEFLEX) 500 MG capsule  3 times daily        08/29/21 0932    fluconazole (DIFLUCAN) 200 MG tablet   Once        08/29/21 0932             Wyvonnia Dusky, MD 08/29/21 1005

## 2021-08-31 LAB — URINE CULTURE: Culture: >=100000 — AB

## 2021-09-13 ENCOUNTER — Ambulatory Visit (INDEPENDENT_AMBULATORY_CARE_PROVIDER_SITE_OTHER): Payer: BC Managed Care – PPO | Admitting: Family Medicine

## 2021-09-13 VITALS — BP 126/74 | HR 96 | Temp 98.1°F | Ht 61.0 in | Wt 194.9 lb

## 2021-09-13 DIAGNOSIS — E785 Hyperlipidemia, unspecified: Secondary | ICD-10-CM | POA: Diagnosis not present

## 2021-09-13 DIAGNOSIS — I152 Hypertension secondary to endocrine disorders: Secondary | ICD-10-CM | POA: Diagnosis not present

## 2021-09-13 DIAGNOSIS — E1159 Type 2 diabetes mellitus with other circulatory complications: Secondary | ICD-10-CM | POA: Diagnosis not present

## 2021-09-13 DIAGNOSIS — E1165 Type 2 diabetes mellitus with hyperglycemia: Secondary | ICD-10-CM

## 2021-09-13 LAB — POCT GLYCOSYLATED HEMOGLOBIN (HGB A1C): Hemoglobin A1C: 10.8 % — AB (ref 4.0–5.6)

## 2021-09-13 LAB — LIPID PANEL
Cholesterol: 151 mg/dL (ref 0–200)
HDL: 45.9 mg/dL (ref >39.00)
NonHDL: 105.11
Total CHOL/HDL Ratio: 3
Triglycerides: 311 mg/dL — ABNORMAL HIGH (ref 0.0–149.0)
VLDL: 62.2 mg/dL — ABNORMAL HIGH (ref 0.0–40.0)

## 2021-09-13 LAB — LDL CHOLESTEROL, DIRECT: Direct LDL: 83 mg/dL

## 2021-09-13 MED ORDER — TRULICITY 1.5 MG/0.5ML ~~LOC~~ SOAJ: 1.5000 mg | SUBCUTANEOUS | 11 refills | Status: DC

## 2021-09-13 NOTE — Progress Notes (Signed)
Established Patient Office Visit  Subjective:  Patient ID: Tracey Anderson, female    DOB: 1976-03-31  Age: 46 y.o. MRN: [id number]  CC:  Chief Complaint  Patient presents with   Follow-up    HPI Tracey Anderson presents for medical follow-up.  She had poorly controlled diabetes at follow-up in October.  She had been on Ozempic but had not been taking this very regularly secondary to national shortage.  Her A1c in October was 9.1%.  We ended up switching her to Trulicity A999333 mg weekly because of availability of Ozempic.  She also remains on extended release metformin.  Previous intolerance with Jardiance.  Her recent blood sugars have been ranging mostly between 150-200 fasting.  She did have recent UTI treated in the ER.  Culture grew out E. coli.  No urinary symptoms at this time.  She has hyperlipidemia treated with atorvastatin.  Overdue for labs in terms of lipids.  She did have recent CBC and CMP in ER and those were reviewed.  Liver transaminases were normal.  She has hypertension treated with diltiazem.  Blood pressure much improved today compared with last visit.  Wt Readings from Last 3 Encounters:  09/13/21 194 lb 14.4 oz (88.4 kg)  08/29/21 192 lb (87.1 kg)  06/12/21 196 lb 1.6 oz (89 kg)     Past Medical History:  Diagnosis Date   Arthritis    Environmental allergies    Gestational diabetes    Hx gestational diabetes    Obesity     Past Surgical History:  Procedure Laterality Date   CHOLECYSTECTOMY  2009   CYSTECTOMY  2003   left breast    Family History  Problem Relation Age of Onset   Hypertension Maternal Grandfather    Diabetes Maternal Grandfather    Stroke Maternal Grandfather    Heart disease Paternal Grandfather     Social History   Socioeconomic History   Marital status: Married    Spouse name: Not on file   Number of children: Not on file   Years of education: Not on file   Highest education level: Not on file  Occupational History    Not on file  Tobacco Use   Smoking status: Never   Smokeless tobacco: Never  Vaping Use   Vaping Use: Never used  Substance and Sexual Activity   Alcohol use: No   Drug use: No   Sexual activity: Yes    Birth control/protection: None  Other Topics Concern   Not on file  Social History Narrative   [employer] Release signed on 01/30/2010   Social Determinants of Health   Financial Resource Strain: Not on file  Food Insecurity: Not on file  Transportation Needs: Not on file  Physical Activity: Not on file  Stress: Not on file  Social Connections: Not on file  Intimate Partner Violence: Not on file    Outpatient Medications Prior to Visit  Medication Sig Dispense Refill   atorvastatin (LIPITOR) 20 MG tablet Take 1 tablet (20 mg total) by mouth daily. 90 tablet 3   diltiazem (CARDIZEM CD) 120 MG 24 hr capsule TAKE 1 CAPSULE BY MOUTH EVERY DAY 90 capsule 3   Dulaglutide (TRULICITY) A999333 0000000 SOPN Inject 0.75 mg into the skin once a week. 2 mL 5   fexofenadine (ALLEGRA) 180 MG tablet Take 180 mg by mouth at bedtime.     metFORMIN (GLUCOPHAGE-XR) 750 MG 24 hr tablet Take 1 tablet (750 mg total) by mouth daily  with breakfast. *appointment required for future refills 90 tablet 3   Multiple Vitamin (MULTIVITAMIN) tablet Take 1 tablet by mouth daily.     terconazole (TERAZOL 7) 0.4 % vaginal cream terconazole 0.4 % vaginal cream  INSERT 1 APPLICATOR(S)FUL EVERY DAY VAGINALLY AT BEDTIME FOR 7 DAYS.     benzonatate (TESSALON) 100 MG capsule Take 1 capsule (100 mg total) by mouth 2 (two) times daily as needed for cough. (Patient not taking: Reported on 09/13/2021) 40 capsule 0   mirabegron ER (MYRBETRIQ) 50 MG TB24 tablet Myrbetriq 50 mg tablet,extended release  Take 1 tablet every day by oral route for 30 days. (Patient not taking: Reported on 09/13/2021)     No facility-administered medications prior to visit.    Allergies  Allergen Reactions   Oxycodone-Acetaminophen  Itching   Jardiance [Empagliflozin] Other (See Comments)    Yeast vaginitis   Oxycodone Itching    ROS Review of Systems  Constitutional:  Negative for fatigue.  Eyes:  Negative for visual disturbance.  Respiratory:  Negative for cough, chest tightness, shortness of breath and wheezing.   Cardiovascular:  Negative for chest pain, palpitations and leg swelling.  Endocrine: Negative for polydipsia and polyuria.  Neurological:  Negative for dizziness, seizures, syncope, weakness, light-headedness and headaches.     Objective:    Physical Exam Constitutional:      Appearance: She is well-developed.  Eyes:     Pupils: Pupils are equal, round, and reactive to light.  Neck:     Thyroid: No thyromegaly.     Vascular: No JVD.  Cardiovascular:     Rate and Rhythm: Normal rate and regular rhythm.     Heart sounds:    No gallop.  Pulmonary:     Effort: Pulmonary effort is normal. No respiratory distress.     Breath sounds: Normal breath sounds. No wheezing or rales.  Musculoskeletal:     Cervical back: Neck supple.     Right lower leg: No edema.     Left lower leg: No edema.  Neurological:     Mental Status: She is alert.    BP 126/74 (BP Location: Left Arm, Patient Position: Sitting, Cuff Size: Normal)    Pulse 96    Temp 98.1 F (36.7 C)    Ht 5\' 1"  (1.549 m)    Wt 194 lb 14.4 oz (88.4 kg)    SpO2 98%    BMI 36.83 kg/m  Wt Readings from Last 3 Encounters:  09/13/21 194 lb 14.4 oz (88.4 kg)  08/29/21 192 lb (87.1 kg)  06/12/21 196 lb 1.6 oz (89 kg)     Health Maintenance Due  Topic Date Due   FOOT EXAM  Never done   Hepatitis C Screening  Never done   URINE MICROALBUMIN  04/18/2020   OPHTHALMOLOGY EXAM  09/08/2021    There are no preventive care reminders to display for this patient.  Lab Results  Component Value Date   TSH 1.43 07/20/2019   Lab Results  Component Value Date   WBC 17.5 (H) 08/29/2021   HGB 14.1 08/29/2021   HCT 42.2 08/29/2021   MCV 84.9  08/29/2021   PLT 243 08/29/2021   Lab Results  Component Value Date   NA 133 (L) 08/29/2021   K 3.9 08/29/2021   CO2 18 (L) 08/29/2021   GLUCOSE 309 (H) 08/29/2021   BUN 16 08/29/2021   CREATININE 0.50 08/29/2021   BILITOT 1.3 (H) 08/29/2021   ALKPHOS 74 08/29/2021   AST  22 08/29/2021   ALT 24 08/29/2021   PROT 7.9 08/29/2021   ALBUMIN 4.1 08/29/2021   CALCIUM 9.1 08/29/2021   ANIONGAP 13 08/29/2021   GFR 114.59 04/19/2019   Lab Results  Component Value Date   CHOL 151 07/20/2019   Lab Results  Component Value Date   HDL 43.30 07/20/2019   Lab Results  Component Value Date   LDLCALC (H) 01/15/2010    109        Total Cholesterol/HDL:CHD Risk Coronary Heart Disease Risk Table                     Men   Women  1/2 Average Risk   3.4   3.3  Average Risk       5.0   4.4  2 X Average Risk   9.6   7.1  3 X Average Risk  23.4   11.0        Use the calculated Patient Ratio above and the CHD Risk Table to determine the patient's CHD Risk.        ATP III CLASSIFICATION (LDL):  <100     mg/dL   Optimal  100-129  mg/dL   Near or Above                    Optimal  130-159  mg/dL   Borderline  160-189  mg/dL   High  >190     mg/dL   Very High   Lab Results  Component Value Date   TRIG (H) 07/20/2019    469.0 Triglyceride is over 400; calculations on Lipids are invalid.   Lab Results  Component Value Date   CHOLHDL 3 07/20/2019   Lab Results  Component Value Date   HGBA1C 9.1 (A) 06/12/2021      Assessment & Plan:   #1 type 2 diabetes.  History of recent poor control with last A1c 9.1%.  She is currently on Trulicity and metformin.  Recheck A1c today.  Consider further titration of Trulicity if indicated  #2 dyslipidemia.  Patient on atorvastatin 20 mg daily.  Check lipid panel.  We elected not to check hepatic since this was done recently at ER visit  #3 hypertension improved today and controlled on diltiazem   No orders of the defined types were placed in  this encounter.   Follow-up: No follow-ups on file.    Carolann Littler, MD

## 2021-09-13 NOTE — Patient Instructions (Signed)
Increase the Trulicity to 1.5 mg [place] weekly  Set up 3 month follow up.

## 2021-09-27 ENCOUNTER — Encounter: Payer: Self-pay | Admitting: Family Medicine

## 2021-09-27 MED ORDER — FLUCONAZOLE 150 MG PO TABS: 150.0000 mg | ORAL_TABLET | Freq: Once | ORAL | 0 refills | Status: AC

## 2021-11-19 DIAGNOSIS — D2262 Melanocytic nevi of left upper limb, including shoulder: Secondary | ICD-10-CM | POA: Diagnosis not present

## 2021-11-19 DIAGNOSIS — L814 Other melanin hyperpigmentation: Secondary | ICD-10-CM | POA: Diagnosis not present

## 2021-11-19 DIAGNOSIS — L732 Hidradenitis suppurativa: Secondary | ICD-10-CM | POA: Diagnosis not present

## 2021-11-19 DIAGNOSIS — K429 Umbilical hernia without obstruction or gangrene: Secondary | ICD-10-CM | POA: Diagnosis not present

## 2021-11-19 DIAGNOSIS — D225 Melanocytic nevi of trunk: Secondary | ICD-10-CM | POA: Diagnosis not present

## 2021-11-19 DIAGNOSIS — L821 Other seborrheic keratosis: Secondary | ICD-10-CM | POA: Diagnosis not present

## 2021-12-13 ENCOUNTER — Ambulatory Visit: Payer: BC Managed Care – PPO | Admitting: Family Medicine

## 2021-12-23 ENCOUNTER — Ambulatory Visit (INDEPENDENT_AMBULATORY_CARE_PROVIDER_SITE_OTHER): Payer: BC Managed Care – PPO | Admitting: Family Medicine

## 2021-12-23 ENCOUNTER — Encounter: Payer: Self-pay | Admitting: Family Medicine

## 2021-12-23 VITALS — BP 136/86 | HR 97 | Temp 98.4°F | Ht 61.0 in | Wt 200.1 lb

## 2021-12-23 DIAGNOSIS — E1159 Type 2 diabetes mellitus with other circulatory complications: Secondary | ICD-10-CM | POA: Diagnosis not present

## 2021-12-23 DIAGNOSIS — E785 Hyperlipidemia, unspecified: Secondary | ICD-10-CM | POA: Diagnosis not present

## 2021-12-23 DIAGNOSIS — E1165 Type 2 diabetes mellitus with hyperglycemia: Secondary | ICD-10-CM | POA: Diagnosis not present

## 2021-12-23 DIAGNOSIS — I152 Hypertension secondary to endocrine disorders: Secondary | ICD-10-CM

## 2021-12-23 LAB — POCT GLYCOSYLATED HEMOGLOBIN (HGB A1C): Hemoglobin A1C: 9.8 % — AB (ref 4.0–5.6)

## 2021-12-23 MED ORDER — TRULICITY 3 MG/0.5ML ~~LOC~~ SOAJ: 3.0000 mg | SUBCUTANEOUS | 11 refills | Status: DC

## 2021-12-23 NOTE — Patient Instructions (Signed)
A1C slightly improved to 9.8% ? ?Increase the Trulicity to 3 mg  once weekly ? ?Set up 3 month follow up.  ?

## 2021-12-23 NOTE — Progress Notes (Signed)
? ?Established Patient Office Visit ? ?Subjective:  ?Patient ID: Tracey Anderson, female    DOB: 28-Apr-1976  Age: 46 y.o. MRN: [id number] ? ?CC:  ?Chief Complaint  ?Patient presents with  ? Follow-up  ? ? ?HPI ?Tracey Anderson presents for medical follow-up.  She has history of hypertension, type 2 diabetes, hyperlipidemia.  Her current medications include Lipitor 20 mg daily, diltiazem CD100 20 mg daily, Trulicity 1.5 mg subcutaneous once weekly, metformin extended release 750 mg daily.  Not monitoring blood sugars regularly but has had several fasting blood sugars recently around 200.  No regular exercise.  She states she has had tendencies to have recurrent yeast infections.  Lipids were checked in January with total cholesterol 151 and LDL cholesterol 83 with triglycerides 311. ? ?Wt Readings from Last 3 Encounters:  ?12/23/21 200 lb 1.6 oz (90.8 kg)  ?09/13/21 194 lb 14.4 oz (88.4 kg)  ?08/29/21 192 lb (87.1 kg)  ? ? ? ?Past Medical History:  ?Diagnosis Date  ? Arthritis   ? Environmental allergies   ? Gestational diabetes   ? Hx gestational diabetes   ? Obesity   ? ? ?Past Surgical History:  ?Procedure Laterality Date  ? CHOLECYSTECTOMY  2009  ? CYSTECTOMY  2003  ? left breast  ? ? ?Family History  ?Problem Relation Age of Onset  ? Hypertension Maternal Grandfather   ? Diabetes Maternal Grandfather   ? Stroke Maternal Grandfather   ? Heart disease Paternal Grandfather   ? ? ?Social History  ? ?Socioeconomic History  ? Marital status: Married  ?  Spouse name: Not on file  ? Number of children: Not on file  ? Years of education: Not on file  ? Highest education level: Not on file  ?Occupational History  ? Not on file  ?Tobacco Use  ? Smoking status: Never  ? Smokeless tobacco: Never  ?Vaping Use  ? Vaping Use: Never used  ?Substance and Sexual Activity  ? Alcohol use: No  ? Drug use: No  ? Sexual activity: Yes  ?  Birth control/protection: None  ?Other Topics Concern  ? Not on file  ?Social History Narrative  ?  Designated Party Release signed on 01/30/2010  ? ?Social Determinants of Health  ? ?Financial Resource Strain: Not on file  ?Food Insecurity: Not on file  ?Transportation Needs: Not on file  ?Physical Activity: Not on file  ?Stress: Not on file  ?Social Connections: Not on file  ?Intimate Partner Violence: Not on file  ? ? ?Outpatient Medications Prior to Visit  ?Medication Sig Dispense Refill  ? atorvastatin (LIPITOR) 20 MG tablet Take 1 tablet (20 mg total) by mouth daily. 90 tablet 3  ? diltiazem (CARDIZEM CD) 120 MG 24 hr capsule TAKE 1 CAPSULE BY MOUTH EVERY DAY 90 capsule 3  ? fexofenadine (ALLEGRA) 180 MG tablet Take 180 mg by mouth at bedtime.    ? metFORMIN (GLUCOPHAGE-XR) 750 MG 24 hr tablet Take 1 tablet (750 mg total) by mouth daily with breakfast. *appointment required for future refills 90 tablet 3  ? mirabegron ER (MYRBETRIQ) 50 MG TB24 tablet     ? Multiple Vitamin (MULTIVITAMIN) tablet Take 1 tablet by mouth daily.    ? terconazole (TERAZOL 7) 0.4 % vaginal cream terconazole 0.4 % vaginal cream ? INSERT 1 APPLICATOR(S)FUL EVERY DAY VAGINALLY AT BEDTIME FOR 7 DAYS.    ? Dulaglutide (TRULICITY) 1.5 MG/0.5ML SOPN Inject 1.5 mg into the skin once a week. 2 mL 11  ? ?  No facility-administered medications prior to visit.  ? ? ?Allergies  ?Allergen Reactions  ? Oxycodone-Acetaminophen Itching  ? Jardiance [Empagliflozin] Other (See Comments)  ?  Yeast vaginitis  ? Oxycodone Itching  ? ? ?ROS ?Review of Systems  ?Constitutional:  Negative for fatigue.  ?Eyes:  Negative for visual disturbance.  ?Respiratory:  Negative for cough, chest tightness, shortness of breath and wheezing.   ?Cardiovascular:  Negative for chest pain, palpitations and leg swelling.  ?Endocrine: Negative for polydipsia and polyuria.  ?Neurological:  Negative for dizziness, seizures, syncope, weakness, light-headedness and headaches.  ? ?  ?Objective:  ?  ?Physical Exam ?Constitutional:   ?   Appearance: She is well-developed.  ?Eyes:  ?    Pupils: Pupils are equal, round, and reactive to light.  ?Neck:  ?   Thyroid: No thyromegaly.  ?   Vascular: No JVD.  ?Cardiovascular:  ?   Rate and Rhythm: Normal rate and regular rhythm.  ?   Heart sounds:  ?  No gallop.  ?Pulmonary:  ?   Effort: Pulmonary effort is normal. No respiratory distress.  ?   Breath sounds: Normal breath sounds. No wheezing or rales.  ?Musculoskeletal:  ?   Cervical back: Neck supple.  ?   Right lower leg: No edema.  ?   Left lower leg: No edema.  ?Neurological:  ?   Mental Status: She is alert.  ? ? ?BP 136/86 (BP Location: Left Arm, Patient Position: Sitting, Cuff Size: Normal)   Pulse 97   Temp 98.4 ?F (36.9 ?C) (Oral)   Ht 5\' 1"  (1.549 m)   Wt 200 lb 1.6 oz (90.8 kg)   SpO2 98%   BMI 37.81 kg/m?  ?Wt Readings from Last 3 Encounters:  ?12/23/21 200 lb 1.6 oz (90.8 kg)  ?09/13/21 194 lb 14.4 oz (88.4 kg)  ?08/29/21 192 lb (87.1 kg)  ? ? ? ?Health Maintenance Due  ?Topic Date Due  ? FOOT EXAM  Never done  ? Hepatitis C Screening  Never done  ? URINE MICROALBUMIN  04/18/2020  ? OPHTHALMOLOGY EXAM  09/08/2021  ? ? ?There are no preventive care reminders to display for this patient. ? ?Lab Results  ?Component Value Date  ? TSH 1.43 07/20/2019  ? ?Lab Results  ?Component Value Date  ? WBC 17.5 (H) 08/29/2021  ? HGB 14.1 08/29/2021  ? HCT 42.2 08/29/2021  ? MCV 84.9 08/29/2021  ? PLT 243 08/29/2021  ? ?Lab Results  ?Component Value Date  ? NA 133 (L) 08/29/2021  ? K 3.9 08/29/2021  ? CO2 18 (L) 08/29/2021  ? GLUCOSE 309 (H) 08/29/2021  ? BUN 16 08/29/2021  ? CREATININE 0.50 08/29/2021  ? BILITOT 1.3 (H) 08/29/2021  ? ALKPHOS 74 08/29/2021  ? AST 22 08/29/2021  ? ALT 24 08/29/2021  ? PROT 7.9 08/29/2021  ? ALBUMIN 4.1 08/29/2021  ? CALCIUM 9.1 08/29/2021  ? ANIONGAP 13 08/29/2021  ? GFR 114.59 04/19/2019  ? ?Lab Results  ?Component Value Date  ? CHOL 151 09/13/2021  ? ?Lab Results  ?Component Value Date  ? HDL 45.90 09/13/2021  ? ?Lab Results  ?Component Value Date  ? LDLCALC (H)  01/15/2010  ?  109        ?Total Cholesterol/HDL:CHD Risk ?Coronary Heart Disease Risk Table ?                    Men   Women ? 1/2 Average Risk   3.4   3.3 ?  Average Risk       5.0   4.4 ? 2 X Average Risk   9.6   7.1 ? 3 X Average Risk  23.4   11.0 ?       ?Use the calculated Patient Ratio ?above and the CHD Risk Table ?to determine the patient's CHD Risk. ?       ?ATP III CLASSIFICATION (LDL): ? <100     mg/dL   Optimal ? 154-008  mg/dL   Near or Above ?                   Optimal ? 130-159  mg/dL   Borderline ? 160-189  mg/dL   High ? >676     mg/dL   Very High  ? ?Lab Results  ?Component Value Date  ? TRIG 311.0 (H) 09/13/2021  ? ?Lab Results  ?Component Value Date  ? CHOLHDL 3 09/13/2021  ? ?Lab Results  ?Component Value Date  ? HGBA1C 9.8 (A) 12/23/2021  ? ? ?  ?Assessment & Plan:  ? ?#1 type 2 diabetes poorly controlled.  A1c has improved slightly from 10.8-9.8 but still far from goal.  We did discuss SGLT2 class but she is already having recurrent yeast vaginitis so this would probably not be a good choice.  She needs to lose some weight.  Surprisingly, her weight has actually gone up with Trulicity.  We did discuss further titrating this to 3 mg subcutaneous once weekly since she is tolerating well at current dose.  Tighten up diet.  Try to increase activity levels.  Recommend 26-month follow-up. ? ?-Also needs urine microalbumin screen at follow-up ? ?#2 hypertension.  Currently on diltiazem.  Consider ACE or ARB at follow-up especially if urine microalbumin screen is positive ? ?#3 dyslipidemia treated with atorvastatin.  Try to follow a low saturated fat diet.  Continue current dose of atorvastatin ? ? ?Meds ordered this encounter  ?Medications  ? Dulaglutide (TRULICITY) 3 MG/0.5ML SOPN  ?  Sig: Inject 3 mg as directed once a week.  ?  Dispense:  2 mL  ?  Refill:  11  ? ? ?Follow-up: Return in about 3 months (around 03/24/2022).  ? ? ?Evelena Peat, MD ?

## 2022-02-06 IMAGING — CT CT ABD-PELV W/ CM
2 of 5 series · 16 of 46 positions shown, 18 images · IV contrast (Omnipaque)
Comparison: 01/11/2016

CLINICAL DATA: Right flank pain for 2 days, urinary frequency, low
back pain, nausea

EXAM:
CT ABDOMEN AND PELVIS WITH CONTRAST
TECHNIQUE: Multidetector CT imaging of the abdomen and pelvis was performed
using the standard protocol following bolus administration of
intravenous contrast.
CONTRAST:  100mL OMNIPAQUE IOHEXOL 300 MG/ML  SOLN

[Series 2: axial st · axial · 0.98mm/px · z∈[-477,-52]mm · 13 of 95 slices shown, 15 images]
[im 5/95  soft-tissue]
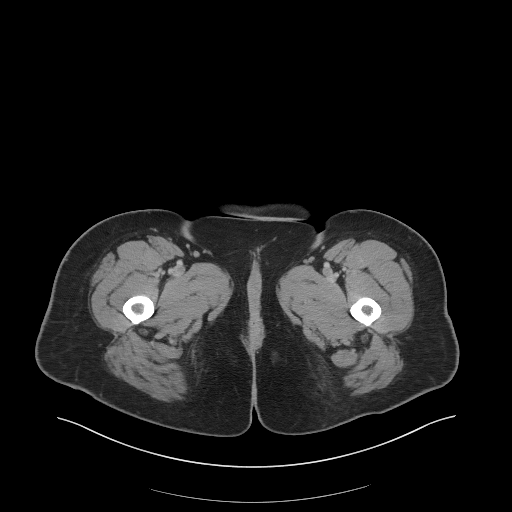
[im 5/95  bone]
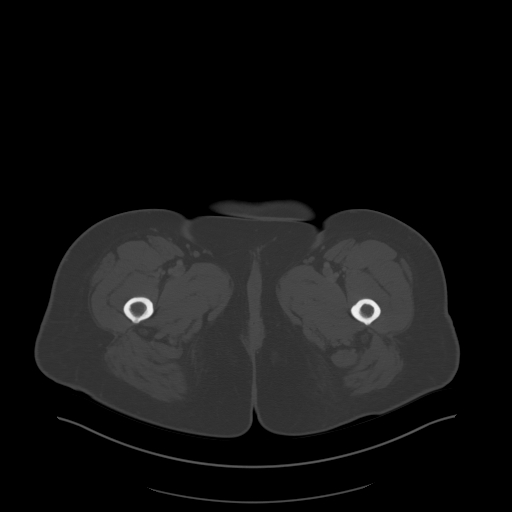
[im 15/95  soft-tissue]
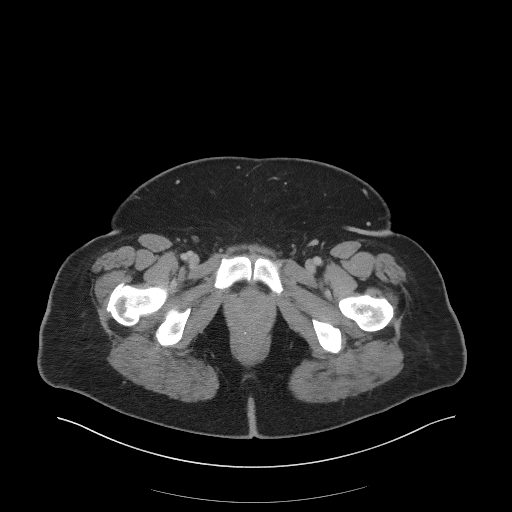
[im 20/95  soft-tissue]
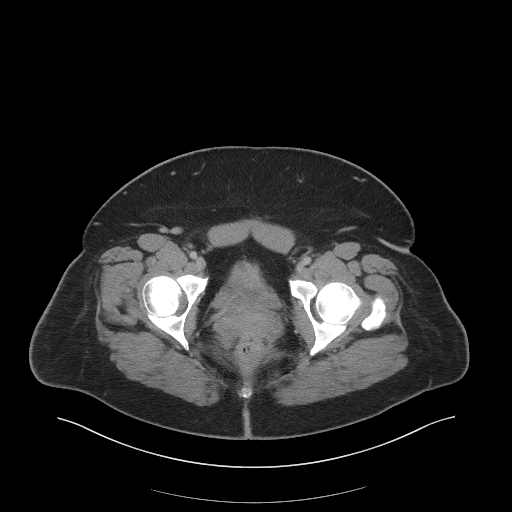
[im 25/95  soft-tissue]
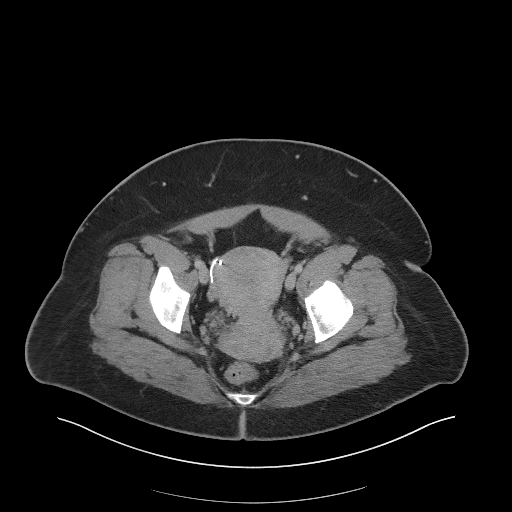
[im 35/95  soft-tissue]
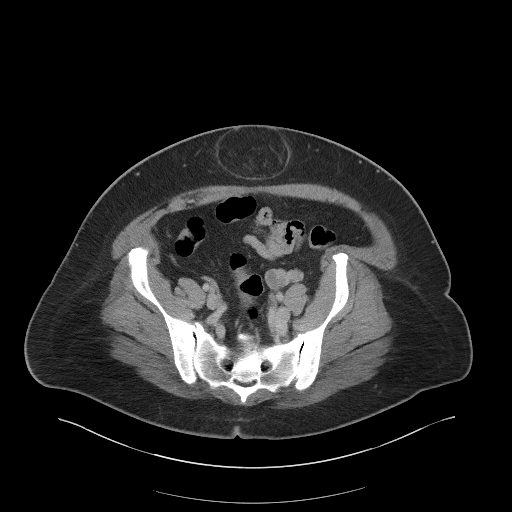
[im 40/95  soft-tissue]
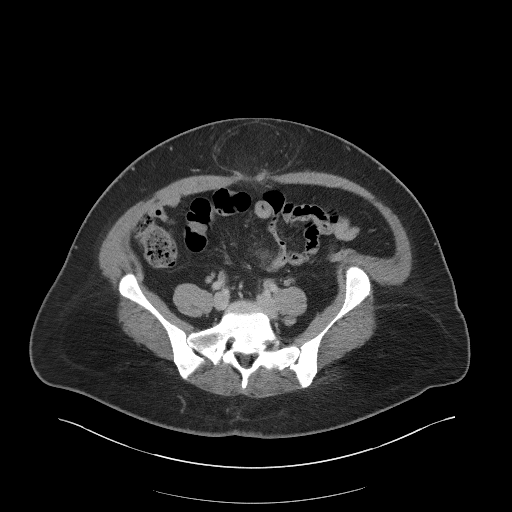
[im 50/95  soft-tissue]
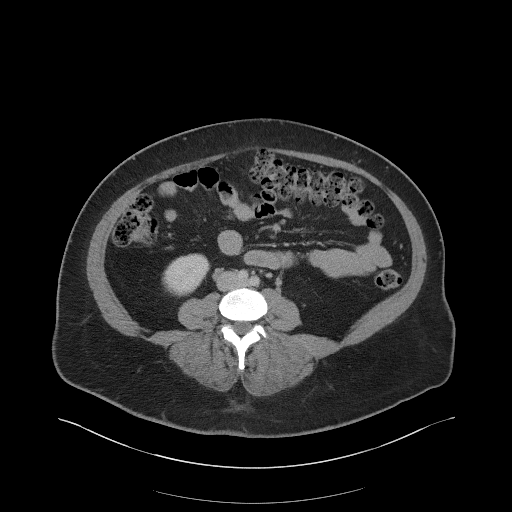
[im 55/95  soft-tissue]
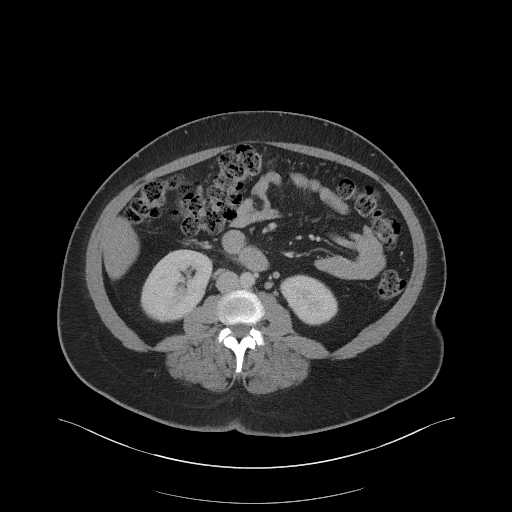
[im 60/95  soft-tissue]
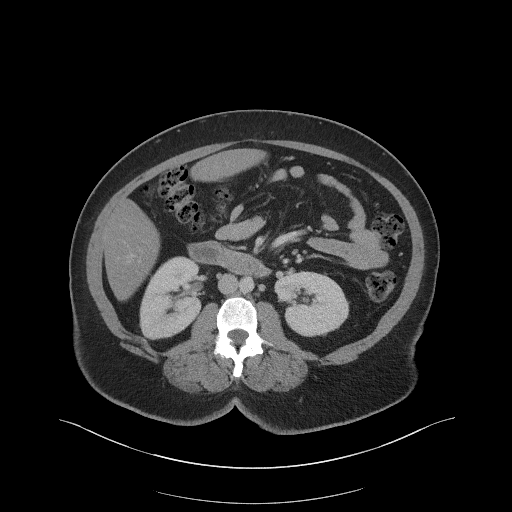
[im 60/95  bone]
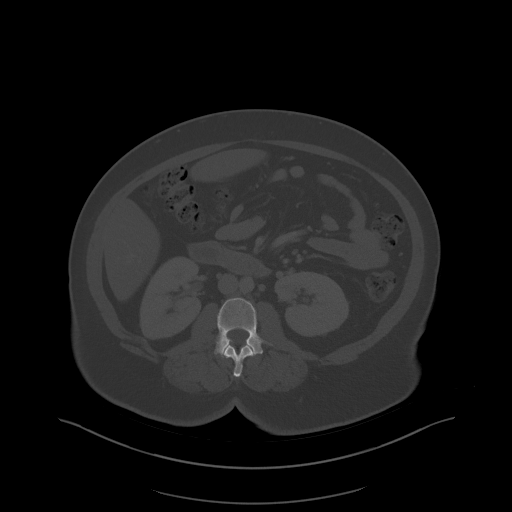
[im 70/95  soft-tissue]
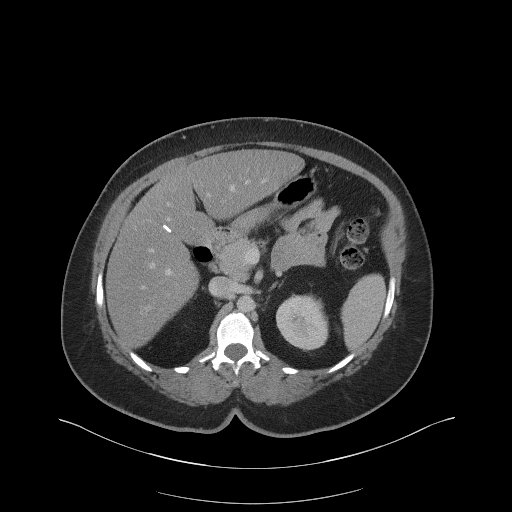
[im 75/95  soft-tissue]
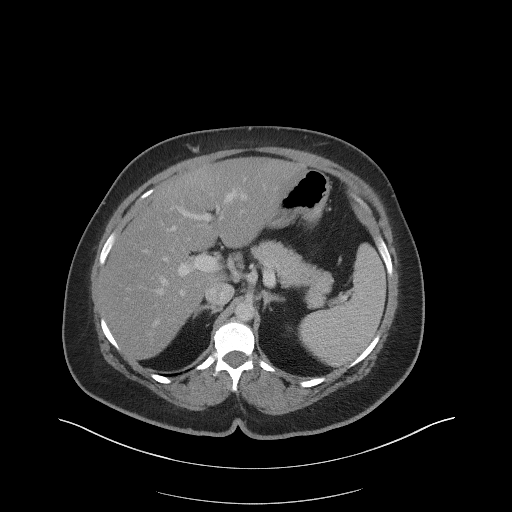
[im 80/95  soft-tissue]
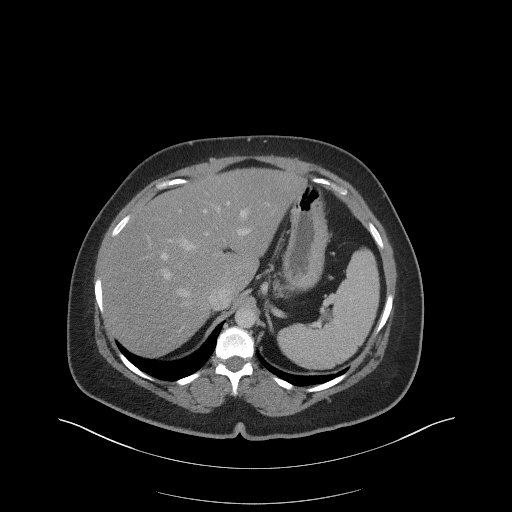
[im 90/95  soft-tissue]
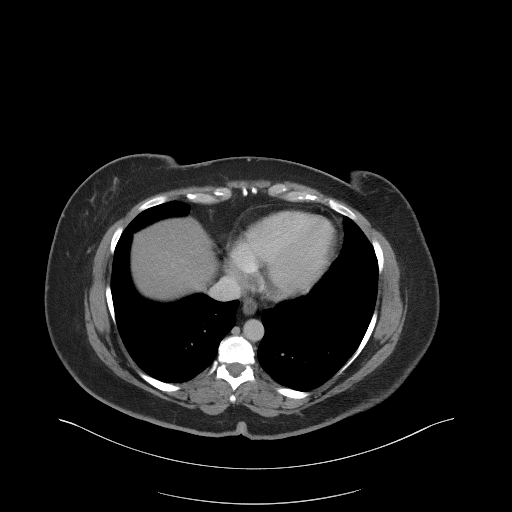

[Series 5: coronal st · coronal · 0.80mm/px · 3 of 106 slices shown]
[im 36/106  soft-tissue]
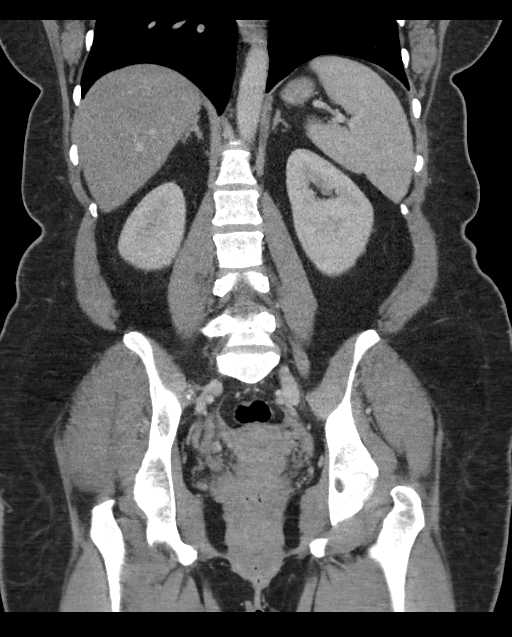
[im 47/106  soft-tissue]
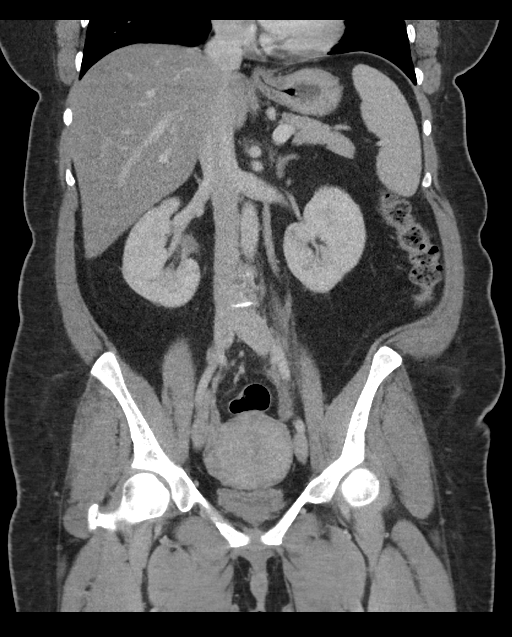
[im 59/106  soft-tissue]
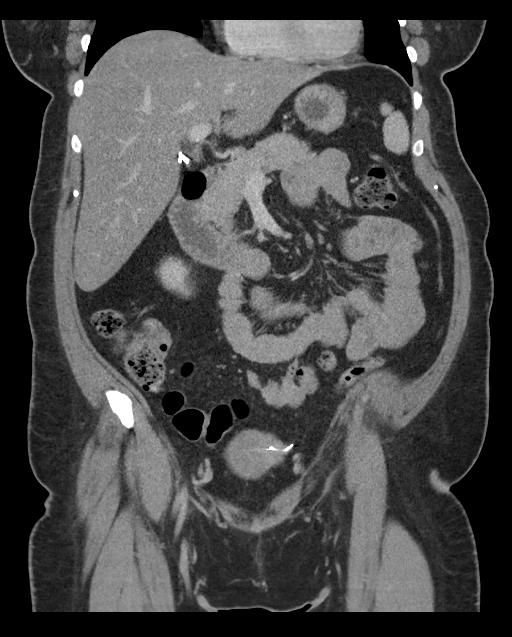

[16 of 46 positions shown; findings below may reference images not displayed]

FINDINGS: Lower chest: No acute abnormality.

Hepatobiliary: Diffuse hypoattenuation of the liver compatible with
hepatic steatosis, improved compared to 5766. No large focal hepatic
abnormality or biliary dilatation. Remote cholecystectomy. Common
bile duct nondilated.

Pancreas: Unremarkable. No pancreatic ductal dilatation or
surrounding inflammatory changes.

Spleen: Normal in size without focal abnormality.

Adrenals/Urinary Tract: Normal adrenal glands. Tiny punctate
nonobstructing left intrarenal calculi noted in the lower pole. No
current renal obstruction, hydronephrosis, hydroureter or
obstructing ureteral calculus. Ureters are symmetric and
decompressed. Bladder collapsed.

Stomach/Bowel: Stomach is within normal limits. Appendix appears
normal. No evidence of bowel wall thickening, distention, or
inflammatory changes.

Vascular/Lymphatic: No significant vascular findings are present. No
enlarged abdominal or pelvic lymph nodes.

Reproductive: Uterus normal in size and anteverted. Ovaries are
normal in size. Dystrophic calcification noted of the right ovary as
before. Tubal occlusion devices present bilaterally. No pelvic free
fluid, fluid collection, hemorrhage, hematoma, or abscess.

Other: Slightly larger fat containing ventral hernia above the
umbilicus. No protruding bowel or incarceration. No obstruction
pattern.

Musculoskeletal: No acute osseous finding.
IMPRESSION: No acute intra-abdominal or pelvic finding.

Interval improvement in the hepatic steatosis.

Remote cholecystectomy

Slightly larger fat containing midline ventral hernia. No other
complicating feature.

Punctate tiny nonobstructing left nephrolithiasis.

## 2022-03-24 ENCOUNTER — Ambulatory Visit: Payer: BC Managed Care – PPO | Admitting: Family Medicine

## 2022-04-24 ENCOUNTER — Ambulatory Visit: Payer: Self-pay

## 2022-04-24 NOTE — Patient Outreach (Signed)
  Care Coordination   04/24/2022 Name: Tracey Anderson MRN: [id number] DOB: 11-30-1975   Care Coordination Outreach Attempts:  An unsuccessful telephone outreach was attempted today to offer the patient information about available care coordination services as a benefit of their health plan.   Follow Up Plan:  Additional outreach attempts will be made to offer the patient care coordination information and services.   Encounter Outcome:  No Answer  Care Coordination Interventions Activated:  No   Care Coordination Interventions:  No, not indicated   Jodelle Gross, RN, BSN, CCM Care Management Coordinator [facility] Phone: [phone]/Fax: [phone]

## 2022-06-17 DIAGNOSIS — Z124 Encounter for screening for malignant neoplasm of cervix: Secondary | ICD-10-CM | POA: Diagnosis not present

## 2022-06-17 DIAGNOSIS — Z6837 Body mass index (BMI) 37.0-37.9, adult: Secondary | ICD-10-CM | POA: Diagnosis not present

## 2022-06-17 DIAGNOSIS — Z1151 Encounter for screening for human papillomavirus (HPV): Secondary | ICD-10-CM | POA: Diagnosis not present

## 2022-06-17 DIAGNOSIS — Z1231 Encounter for screening mammogram for malignant neoplasm of breast: Secondary | ICD-10-CM | POA: Diagnosis not present

## 2022-06-17 DIAGNOSIS — Z01419 Encounter for gynecological examination (general) (routine) without abnormal findings: Secondary | ICD-10-CM | POA: Diagnosis not present

## 2022-06-19 ENCOUNTER — Other Ambulatory Visit: Payer: Self-pay | Admitting: Obstetrics and Gynecology

## 2022-06-19 ENCOUNTER — Other Ambulatory Visit: Payer: Self-pay | Admitting: Family Medicine

## 2022-06-19 DIAGNOSIS — E119 Type 2 diabetes mellitus without complications: Secondary | ICD-10-CM

## 2022-06-19 DIAGNOSIS — R928 Other abnormal and inconclusive findings on diagnostic imaging of breast: Secondary | ICD-10-CM

## 2022-06-20 DIAGNOSIS — R928 Other abnormal and inconclusive findings on diagnostic imaging of breast: Secondary | ICD-10-CM | POA: Diagnosis not present

## 2022-06-20 DIAGNOSIS — R921 Mammographic calcification found on diagnostic imaging of breast: Secondary | ICD-10-CM | POA: Diagnosis not present

## 2022-06-20 DIAGNOSIS — R92322 Mammographic fibroglandular density, left breast: Secondary | ICD-10-CM | POA: Diagnosis not present

## 2022-08-21 DIAGNOSIS — E119 Type 2 diabetes mellitus without complications: Secondary | ICD-10-CM | POA: Diagnosis not present

## 2022-08-25 ENCOUNTER — Other Ambulatory Visit: Payer: Self-pay | Admitting: Surgery

## 2022-08-25 DIAGNOSIS — K420 Umbilical hernia with obstruction, without gangrene: Secondary | ICD-10-CM | POA: Diagnosis not present

## 2022-09-14 ENCOUNTER — Other Ambulatory Visit: Payer: Self-pay | Admitting: Family Medicine

## 2022-09-14 DIAGNOSIS — E119 Type 2 diabetes mellitus without complications: Secondary | ICD-10-CM

## 2022-11-07 DIAGNOSIS — H10029 Other mucopurulent conjunctivitis, unspecified eye: Secondary | ICD-10-CM | POA: Diagnosis not present

## 2022-11-07 DIAGNOSIS — L03213 Periorbital cellulitis: Secondary | ICD-10-CM | POA: Diagnosis not present

## 2023-01-12 ENCOUNTER — Other Ambulatory Visit: Payer: Self-pay

## 2023-01-12 ENCOUNTER — Encounter (HOSPITAL_BASED_OUTPATIENT_CLINIC_OR_DEPARTMENT_OTHER): Payer: Self-pay | Admitting: Surgery

## 2023-01-16 ENCOUNTER — Encounter (HOSPITAL_BASED_OUTPATIENT_CLINIC_OR_DEPARTMENT_OTHER)
Admission: RE | Admit: 2023-01-16 | Discharge: 2023-01-16 | Disposition: A | Discharge status: Home / Self Care | Payer: BC Managed Care – PPO | Source: Ambulatory Visit | Attending: Surgery | Admitting: Surgery

## 2023-01-16 DIAGNOSIS — Z794 Long term (current) use of insulin: Secondary | ICD-10-CM | POA: Insufficient documentation

## 2023-01-16 DIAGNOSIS — K420 Umbilical hernia with obstruction, without gangrene: Secondary | ICD-10-CM | POA: Diagnosis not present

## 2023-01-16 DIAGNOSIS — Z79899 Other long term (current) drug therapy: Secondary | ICD-10-CM | POA: Diagnosis not present

## 2023-01-16 DIAGNOSIS — I10 Essential (primary) hypertension: Secondary | ICD-10-CM | POA: Diagnosis not present

## 2023-01-16 DIAGNOSIS — Z7984 Long term (current) use of oral hypoglycemic drugs: Secondary | ICD-10-CM | POA: Diagnosis not present

## 2023-01-16 DIAGNOSIS — Z01818 Encounter for other preprocedural examination: Secondary | ICD-10-CM | POA: Insufficient documentation

## 2023-01-16 DIAGNOSIS — E119 Type 2 diabetes mellitus without complications: Secondary | ICD-10-CM | POA: Diagnosis not present

## 2023-01-16 LAB — BASIC METABOLIC PANEL
Anion gap: 11 (ref 5–15)
BUN: 8 mg/dL (ref 6–20)
CO2: 23 mmol/L (ref 22–32)
Calcium: 8.9 mg/dL (ref 8.9–10.3)
Chloride: 100 mmol/L (ref 98–111)
Creatinine, Ser: 0.5 mg/dL (ref 0.44–1.00)
GFR, Estimated: >60 mL/min (ref >60)
Glucose, Bld: 339 mg/dL — ABNORMAL HIGH (ref 70–99)
Potassium: 4.2 mmol/L (ref 3.5–5.1)
Sodium: 134 mmol/L — ABNORMAL LOW (ref 135–145)

## 2023-01-18 NOTE — H&P (Signed)
  REFERRING PHYSICIAN: Juluis Mire, MD  PROVIDER: Wayne Both, MD  MRN: [id number] DOB: July 30, 1976  Subjective  Chief Complaint: New Consultation and Umbilical Hernia   History of Present Illness: Tracey Anderson is a 47 y.o. female who is seen  as an office consultation for evaluation of New Consultation and Umbilical Hernia .  This is a 47 year old female with a known umbilical hernia. This has been seen on prior CT scans. It contains incarcerated omentum. She only intermittently has some mild discomfort from the hernia but it is getting larger. I recently had operated on her mother emergently so she is now decided she wants surgery. She is otherwise doing very well and has no cardiopulmonary issues and no obstructive symptoms  Review of Systems: A complete review of systems was obtained from the patient. I have reviewed this information and discussed as appropriate with the patient. See HPI as well for other ROS.  ROS  Medical History: History reviewed. No pertinent past medical history.  There is no problem list on file for this patient.  Past Surgical History: Procedure Laterality Date cyst removal breast LAPAROSCOPIC CHOLECYSTECTOMY   Allergies Allergen Reactions Oxycodone-Acetaminophen Itching  Current Outpatient Medications on File Prior to Visit Medication Sig Dispense Refill atorvastatin (LIPITOR) 20 MG tablet Take 1 tablet by mouth once daily dilTIAZem (CARDIZEM CD) 120 MG XR capsule Take 120 mg by mouth once daily metFORMIN (GLUCOPHAGE-XR) 750 MG XR tablet TAKE 1 TABLET (750 MG TOTAL) BY MOUTH DAILY WITH BREAKFAST. *APPOINTMENT REQUIRED FOR FUTURE REFILLS  No current facility-administered medications on file prior to visit.  Family History Problem Relation Age of Onset High blood pressure (Hypertension) Mother High blood pressure (Hypertension) Father Hyperlipidemia (Elevated cholesterol) Father Coronary Artery Disease (Blocked arteries around  heart) Father Diabetes Father   Social History  Tobacco Use Smoking Status Never Smokeless Tobacco Never   Social History  Socioeconomic History Marital status: Married Tobacco Use Smoking status: Never Smokeless tobacco: Never Substance and Sexual Activity Alcohol use: Not Currently Drug use: Never  Objective:  Vitals:  BP: (!) 145/90 Pulse: 105 Temp: 36.7 C (98 F) SpO2: 98% Weight: 88.5 kg (195 lb) Height: 157.5 cm (5\' 2" )  Body mass index is 35.67 kg/m.  Physical Exam  She appears well on exam  He has a chronically incarcerated umbilical hernia with approximately 2 cm fascial defect and omentum incarcerated in the hernia. It is nontender and there are no skin changes  Labs, Imaging and Diagnostic Testing: I reviewed her CAT scan of the abdomen and pelvis  Assessment and Plan:  Diagnoses and all orders for this visit:  Incarcerated umbilical hernia    We discussed hernias, abdominal wall anatomy, and the reason to repair hernias to avoid them getting larger and causing strangulation of the bowel. Her hernia is definitely getting much larger. The fascial defect is only 2 cm but there is a large amount of omentum it looks like in the hernia sac on CT scan. I recommend an open repair with mesh as an outpatient. I explained the surgical procedure with her in detail. We discussed the risks which includes but is not limited to bleeding, infection, injury to surrounding structures, the need to use mesh, hernia recurrence, cardiopulmonary issues, postoperative recovery, etc. She understands and wished to proceed with surgery which will be scheduled.

## 2023-01-19 ENCOUNTER — Other Ambulatory Visit: Payer: Self-pay | Admitting: Surgery

## 2023-01-19 ENCOUNTER — Other Ambulatory Visit: Payer: Self-pay

## 2023-01-19 ENCOUNTER — Ambulatory Visit (HOSPITAL_BASED_OUTPATIENT_CLINIC_OR_DEPARTMENT_OTHER): Payer: BC Managed Care – PPO | Admitting: Certified Registered"

## 2023-01-19 ENCOUNTER — Ambulatory Visit (HOSPITAL_BASED_OUTPATIENT_CLINIC_OR_DEPARTMENT_OTHER)
Admission: RE | Admit: 2023-01-19 | Discharge: 2023-01-19 | Disposition: A | Discharge status: Home / Self Care | Payer: BC Managed Care – PPO | Attending: Surgery | Admitting: Surgery

## 2023-01-19 ENCOUNTER — Encounter (HOSPITAL_BASED_OUTPATIENT_CLINIC_OR_DEPARTMENT_OTHER)
Admission: RE | Disposition: A | Discharge status: Home / Self Care | Payer: Self-pay | Source: Home / Self Care | Attending: Surgery

## 2023-01-19 ENCOUNTER — Encounter (HOSPITAL_BASED_OUTPATIENT_CLINIC_OR_DEPARTMENT_OTHER): Payer: Self-pay | Admitting: Surgery

## 2023-01-19 DIAGNOSIS — Z7984 Long term (current) use of oral hypoglycemic drugs: Secondary | ICD-10-CM | POA: Diagnosis not present

## 2023-01-19 DIAGNOSIS — Z79899 Other long term (current) drug therapy: Secondary | ICD-10-CM | POA: Diagnosis not present

## 2023-01-19 DIAGNOSIS — Z794 Long term (current) use of insulin: Secondary | ICD-10-CM | POA: Insufficient documentation

## 2023-01-19 DIAGNOSIS — Z01818 Encounter for other preprocedural examination: Secondary | ICD-10-CM

## 2023-01-19 DIAGNOSIS — I10 Essential (primary) hypertension: Secondary | ICD-10-CM | POA: Insufficient documentation

## 2023-01-19 DIAGNOSIS — E1165 Type 2 diabetes mellitus with hyperglycemia: Secondary | ICD-10-CM | POA: Diagnosis not present

## 2023-01-19 DIAGNOSIS — K420 Umbilical hernia with obstruction, without gangrene: Secondary | ICD-10-CM | POA: Diagnosis not present

## 2023-01-19 DIAGNOSIS — E119 Type 2 diabetes mellitus without complications: Secondary | ICD-10-CM | POA: Diagnosis not present

## 2023-01-19 HISTORY — PX: INSERTION OF MESH: SHX5868

## 2023-01-19 HISTORY — DX: Essential (primary) hypertension: I10

## 2023-01-19 HISTORY — PX: UMBILICAL HERNIA REPAIR: SHX196

## 2023-01-19 LAB — GLUCOSE, CAPILLARY
Glucose-Capillary: 209 mg/dL — ABNORMAL HIGH (ref 70–99)
Glucose-Capillary: 200 mg/dL — ABNORMAL HIGH (ref 70–99)

## 2023-01-19 LAB — POCT PREGNANCY, URINE: Preg Test, Ur: NEGATIVE

## 2023-01-19 SURGERY — REPAIR, HERNIA, UMBILICAL, ADULT
Anesthesia: General | Site: Abdomen

## 2023-01-19 MED ORDER — FENTANYL CITRATE (PF) 100 MCG/2ML IJ SOLN
INTRAMUSCULAR | Status: AC
  Filled 2023-01-19: qty 2

## 2023-01-19 MED ORDER — DEXAMETHASONE SODIUM PHOSPHATE 10 MG/ML IJ SOLN
INTRAMUSCULAR | Status: AC
  Filled 2023-01-19: qty 1

## 2023-01-19 MED ORDER — FENTANYL CITRATE (PF) 100 MCG/2ML IJ SOLN
INTRAMUSCULAR | Status: DC | PRN
  Administered 2023-01-19 (×4): 50 ug via INTRAVENOUS

## 2023-01-19 MED ORDER — LACTATED RINGERS IV SOLN: INTRAVENOUS | Status: DC

## 2023-01-19 MED ORDER — BUPIVACAINE-EPINEPHRINE 0.5% -1:200000 IJ SOLN
INTRAMUSCULAR | Status: DC | PRN
  Administered 2023-01-19: 20 mL

## 2023-01-19 MED ORDER — MIDAZOLAM HCL 2 MG/2ML IJ SOLN
INTRAMUSCULAR | Status: DC | PRN
  Administered 2023-01-19: 2 mg via INTRAVENOUS

## 2023-01-19 MED ORDER — PROPOFOL 10 MG/ML IV BOLUS
INTRAVENOUS | Status: DC | PRN
  Administered 2023-01-19: 200 mg via INTRAVENOUS

## 2023-01-19 MED ORDER — LACTATED RINGERS IV SOLN: INTRAVENOUS | Status: DC | PRN

## 2023-01-19 MED ORDER — TRAMADOL HCL 50 MG PO TABS: 50.0000 mg | ORAL_TABLET | Freq: Four times a day (QID) | ORAL | 0 refills | Status: DC | PRN

## 2023-01-19 MED ORDER — LIDOCAINE HCL (CARDIAC) PF 100 MG/5ML IV SOSY
PREFILLED_SYRINGE | INTRAVENOUS | Status: DC | PRN
  Administered 2023-01-19: 60 mg via INTRAVENOUS

## 2023-01-19 MED ORDER — CEFAZOLIN SODIUM-DEXTROSE 2-4 GM/100ML-% IV SOLN: 2.0000 g | Freq: Three times a day (TID) | INTRAVENOUS | Status: DC

## 2023-01-19 MED ORDER — ACETAMINOPHEN 500 MG PO TABS
ORAL_TABLET | ORAL | Status: AC
  Filled 2023-01-19: qty 2

## 2023-01-19 MED ORDER — FENTANYL CITRATE (PF) 100 MCG/2ML IJ SOLN
25.0000 ug | INTRAMUSCULAR | Status: DC | PRN
  Administered 2023-01-19 (×2): 50 ug via INTRAVENOUS

## 2023-01-19 MED ORDER — PROPOFOL 10 MG/ML IV BOLUS
INTRAVENOUS | Status: AC
  Filled 2023-01-19: qty 20

## 2023-01-19 MED ORDER — ACETAMINOPHEN 500 MG PO TABS
1000.0000 mg | ORAL_TABLET | Freq: Once | ORAL | Status: AC
  Administered 2023-01-19: 1000 mg via ORAL

## 2023-01-19 MED ORDER — CEFAZOLIN SODIUM-DEXTROSE 2-3 GM-%(50ML) IV SOLR
INTRAVENOUS | Status: DC | PRN
  Administered 2023-01-19: 2 g via INTRAVENOUS

## 2023-01-19 MED ORDER — CEFAZOLIN SODIUM-DEXTROSE 2-4 GM/100ML-% IV SOLN
INTRAVENOUS | Status: AC
  Filled 2023-01-19: qty 100

## 2023-01-19 MED ORDER — ONDANSETRON HCL 4 MG/2ML IJ SOLN
INTRAMUSCULAR | Status: AC
  Filled 2023-01-19: qty 2

## 2023-01-19 MED ORDER — ONDANSETRON HCL 4 MG/2ML IJ SOLN
INTRAMUSCULAR | Status: DC | PRN
  Administered 2023-01-19: 4 mg via INTRAVENOUS

## 2023-01-19 MED ORDER — LIDOCAINE 2% (20 MG/ML) 5 ML SYRINGE
INTRAMUSCULAR | Status: AC
  Filled 2023-01-19: qty 5

## 2023-01-19 SURGICAL SUPPLY — 40 items
ADH SKN CLS APL DERMABOND .7 (GAUZE/BANDAGES/DRESSINGS) ×1
APL PRP STRL LF DISP 70% ISPRP (MISCELLANEOUS) ×1
BLADE SURG 15 STRL LF DISP TIS (BLADE) ×1 IMPLANT
BLADE SURG 15 STRL SS (BLADE) ×1
CANISTER SUCT 1200ML W/VALVE (MISCELLANEOUS) IMPLANT
CHLORAPREP W/TINT 26 (MISCELLANEOUS) ×1 IMPLANT
COVER BACK TABLE 60X90IN (DRAPES) ×1 IMPLANT
COVER MAYO STAND STRL (DRAPES) ×1 IMPLANT
DERMABOND ADVANCED .7 DNX12 (GAUZE/BANDAGES/DRESSINGS) ×2 IMPLANT
DRAPE LAPAROTOMY 100X72 PEDS (DRAPES) ×1 IMPLANT
DRAPE UTILITY XL STRL (DRAPES) ×1 IMPLANT
ELECT REM PT RETURN 9FT ADLT (ELECTROSURGICAL) ×1
ELECTRODE REM PT RTRN 9FT ADLT (ELECTROSURGICAL) ×1 IMPLANT
GLOVE SURG SIGNA 7.5 PF LTX (GLOVE) ×1 IMPLANT
GOWN STRL REUS W/ TWL LRG LVL3 (GOWN DISPOSABLE) ×1 IMPLANT
GOWN STRL REUS W/ TWL XL LVL3 (GOWN DISPOSABLE) ×1 IMPLANT
GOWN STRL REUS W/TWL LRG LVL3 (GOWN DISPOSABLE) ×1
GOWN STRL REUS W/TWL XL LVL3 (GOWN DISPOSABLE) ×1
MESH VENTRALEX ST 2.5 CRC MED (Mesh General) IMPLANT
NDL HYPO 25X1 1.5 SAFETY (NEEDLE) ×1 IMPLANT
NEEDLE HYPO 25X1 1.5 SAFETY (NEEDLE) ×1 IMPLANT
NS IRRIG 1000ML POUR BTL (IV SOLUTION) IMPLANT
PACK BASIN DAY SURGERY FS (CUSTOM PROCEDURE TRAY) ×1 IMPLANT
PENCIL SMOKE EVACUATOR (MISCELLANEOUS) ×1 IMPLANT
SLEEVE SCD COMPRESS KNEE MED (STOCKING) ×1 IMPLANT
SPIKE FLUID TRANSFER (MISCELLANEOUS) IMPLANT
SPONGE T-LAP 4X18 ~~LOC~~+RFID (SPONGE) IMPLANT
SUT MNCRL AB 4-0 PS2 18 (SUTURE) ×1 IMPLANT
SUT NOVA 0 T19/GS 22DT (SUTURE) IMPLANT
SUT NOVA NAB DX-16 0-1 5-0 T12 (SUTURE) IMPLANT
SUT NOVA NAB GS-21 1 T12 (SUTURE) IMPLANT
SUT SILK 2 0 SH (SUTURE) IMPLANT
SUT VIC AB 2-0 SH 27 (SUTURE)
SUT VIC AB 2-0 SH 27XBRD (SUTURE) IMPLANT
SUT VIC AB 3-0 SH 27 (SUTURE) ×1
SUT VIC AB 3-0 SH 27X BRD (SUTURE) ×1 IMPLANT
SYR CONTROL 10ML LL (SYRINGE) ×1 IMPLANT
TOWEL GREEN STERILE FF (TOWEL DISPOSABLE) ×1 IMPLANT
TUBE CONNECTING 20X1/4 (TUBING) IMPLANT
YANKAUER SUCT BULB TIP NO VENT (SUCTIONS) IMPLANT

## 2023-01-19 NOTE — Transfer of Care (Signed)
Immediate Anesthesia Transfer of Care Note  Patient: Tracey Anderson  Procedure(s) Performed: OPEN UMBILICAL HERNIA REPAIR WITH MESH (Abdomen) INSERTION OF MESH (Abdomen)  Patient Location: PACU  Anesthesia Type:General  Level of Consciousness: awake, alert , and oriented  Airway & Oxygen Therapy: Patient Spontanous Breathing and Patient connected to face mask oxygen  Post-op Assessment: Report given to RN and Post -op Vital signs reviewed and stable  Post vital signs: Reviewed and stable  Last Vitals:  Vitals Value Taken Time  BP 145/92 01/19/23 1319  Temp    Pulse 114 01/19/23 1320  Resp 20 01/19/23 1320  SpO2 96 % 01/19/23 1320  Vitals shown include unvalidated device data.  Last Pain:  Vitals:   01/19/23 1141  TempSrc: Temporal  PainSc: 0-No pain         Complications: No notable events documented.

## 2023-01-19 NOTE — Interval H&P Note (Signed)
History and Physical Interval Note: No change in H and P  01/19/2023 11:15 AM  Tracey Anderson  has presented today for surgery, with the diagnosis of INCARCERATED UMBILICAL HERNIA.  The various methods of treatment have been discussed with the patient and family. After consideration of risks, benefits and other options for treatment, the patient has consented to  Procedure(s) with comments: OPEN UMBILICAL HERNIA REPAIR WITH MESH (N/A) - LMA as a surgical intervention.  The patient's history has been reviewed, patient examined, no change in status, stable for surgery.  I have reviewed the patient's chart and labs.  Questions were answered to the patient's satisfaction.     Tracey Anderson

## 2023-01-19 NOTE — Anesthesia Preprocedure Evaluation (Addendum)
Anesthesia Evaluation  Patient identified by MRN, date of birth, ID band Patient awake    Reviewed: Allergy & Precautions, NPO status , Patient's Chart, lab work & pertinent test results  Airway Mallampati: III  TM Distance: >3 FB Neck ROM: Full  Mouth opening: Limited Mouth Opening  Dental no notable dental hx. (+) Teeth Intact, Dental Advisory Given   Pulmonary neg pulmonary ROS   Pulmonary exam normal breath sounds clear to auscultation       Cardiovascular hypertension, Pt. on medications Normal cardiovascular exam Rhythm:Regular Rate:Normal     Neuro/Psych  Headaches PSYCHIATRIC DISORDERS Anxiety Depression       GI/Hepatic negative GI ROS, Neg liver ROS,,,  Endo/Other  diabetes, Poorly Controlled, Type 2, Oral Hypoglycemic Agents    Renal/GU negative Renal ROS  negative genitourinary   Musculoskeletal negative musculoskeletal ROS (+)    Abdominal   Peds  Hematology negative hematology ROS (+)   Anesthesia Other Findings   Reproductive/Obstetrics                             Anesthesia Physical Anesthesia Plan  ASA: 3  Anesthesia Plan: General   Post-op Pain Management: Tylenol PO (pre-op)*   Induction: Intravenous  PONV Risk Score and Plan: 3 and Ondansetron, Dexamethasone and Midazolam  Airway Management Planned: LMA  Additional Equipment:   Intra-op Plan:   Post-operative Plan: Extubation in OR  Informed Consent: I have reviewed the patients History and Physical, chart, labs and discussed the procedure including the risks, benefits and alternatives for the proposed anesthesia with the patient or authorized representative who has indicated his/her understanding and acceptance.     Dental advisory given  Plan Discussed with: CRNA  Anesthesia Plan Comments:        Anesthesia Quick Evaluation

## 2023-01-19 NOTE — Op Note (Signed)
   Tracey Anderson 01/19/2023   Pre-op Diagnosis: INCARCERATED UMBILICAL HERNIA 2 CM FASCIAL DEFECT     Post-op Diagnosis: SAME  Procedure(s): OPEN UMBILICAL HERNIA REPAIR WITH MESH 2 CM FASCIAL DEFECT  Surgeon(s): Abigail Miyamoto, MD  Anesthesia: General  Staff:  Circulator: Maryan Rued, RN Scrub Person: Wardell Heath, CST  Estimated Blood Loss: Minimal               Findings: The patient was found to have an incarcerated umbilical hernia.  It contained a large amount of omentum.  The fascial defect was 2 cm in size and was repaired with a 6.4 cm round ventral Prolene patch from [facility]  Procedure: The patient was brought to the operating room and identified as the correct patient.  She was placed supine on the operating room table and general anesthesia was induced.  Her abdomen was then prepped and draped in the usual sterile fashion.  I anesthetized the skin at the lower part of the umbilicus with Marcaine and then made a semicircular incision at the lower edge of the umbilicus with a 15 blade.  I took this down to the hernia sac which I then opened.  The patient had a very large amount of omentum involved in the hernia sac.  I freed up the attachments circumferentially with the cautery.  1 bleeding vessel in the mesentery had to be tied off with a 2-0 silk tie.  The fascial defect was 2 cm in size.  I had to open the fascia superiorly with the cautery in order to finally reduce the large amount of omentum back into the abdominal cavity.  We then evaluated the edges of the fascia and found no other attachments and hemostasis.  To be achieved.  A 6.4 cm round ventral Prolene patch from [facility] was brought to the field.  It was placed through the fascial opening and then pulled up against the peritoneum with the ties.  The mesh was then sutured in place with 0 Novafil suture circumferentially.  The stay ties were then cut and I closed the fascia over the top of the mesh with a running #1  Novafil suture.  Wide coverage of the fascial defect and good closure of the fascia peer to be achieved.  Hemostasis appeared to be achieved.  I anesthetized the fascia circumferentially with Marcaine.  I then closed the subcutaneous tissue with interrupted 3-0 Vicryl sutures and closed the skin with a running 4-0 Monocryl.  Dermabond was then applied.  The patient tolerated the procedure well.  All the counts were correct at the end of the procedure.  The patient was then extubated in the operating room and taken in a stable condition to the recovery room.          Abigail Miyamoto   Date: 01/19/2023  Time: 1:19 PM

## 2023-01-19 NOTE — Anesthesia Procedure Notes (Signed)
Procedure Name: LMA Insertion Date/Time: 01/19/2023 12:37 PM  Performed by: Karen Kitchens, CRNAPre-anesthesia Checklist: Patient identified, Emergency Drugs available, Suction available and Patient being monitored Patient Re-evaluated:Patient Re-evaluated prior to induction Oxygen Delivery Method: Circle system utilized Preoxygenation: Pre-oxygenation with 100% oxygen Induction Type: IV induction Ventilation: Mask ventilation without difficulty LMA: LMA inserted LMA Size: 4.0 Number of attempts: 1 Airway Equipment and Method: Bite block Placement Confirmation: positive ETCO2, breath sounds checked- equal and bilateral and CO2 detector Tube secured with: Tape Dental Injury: Teeth and Oropharynx as per pre-operative assessment

## 2023-01-19 NOTE — Discharge Instructions (Addendum)
Post Anesthesia Home Care Instructions  Activity: Get plenty of rest for the remainder of the day. A responsible individual must stay with you for 24 hours following the procedure.  For the next 24 hours, DO NOT: -Drive a car -Advertising copywriter -Drink alcoholic beverages -Take any medication unless instructed by your physician -Make any legal decisions or sign important papers.  Meals: Start with liquid foods such as gelatin or soup. Progress to regular foods as tolerated. Avoid greasy, spicy, heavy foods. If nausea and/or vomiting occur, drink only clear liquids until the nausea and/or vomiting subsides. Call your physician if vomiting continues.  Special Instructions/Symptoms: Your throat may feel dry or sore from the anesthesia or the breathing tube placed in your throat during surgery. If this causes discomfort, gargle with warm salt water. The discomfort should disappear within 24 hours.  If you had a scopolamine patch placed behind your ear for the management of post- operative nausea and/or vomiting:  1. The medication in the patch is effective for 72 hours, after which it should be removed.  Wrap patch in a tissue and discard in the trash. Wash hands thoroughly with soap and water. 2. You may remove the patch earlier than 72 hours if you experience unpleasant side effects which may include dry mouth, dizziness or visual disturbances. 3. Avoid touching the patch. Wash your hands with soap and water after contact with the patch.      Next tylenol may be taken at 6p      [facility] Office Phone Number [phone]  BREAST BIOPSY/ PARTIAL MASTECTOMY: POST OP INSTRUCTIONS  Always review your discharge instruction sheet given to you by the facility where your surgery was performed.  IF YOU HAVE DISABILITY OR FAMILY LEAVE FORMS, YOU MUST BRING THEM TO THE OFFICE FOR PROCESSING.  DO NOT GIVE THEM TO YOUR DOCTOR.  A prescription for pain medication may be  given to you upon discharge.  Take your pain medication as prescribed, if needed.  If narcotic pain medicine is not needed, then you may take acetaminophen (Tylenol) or ibuprofen (Advil) as needed. Take your usually prescribed medications unless otherwise directed If you need a refill on your pain medication, please contact your pharmacy.  They will contact our office to request authorization.  Prescriptions will not be filled after 5pm or on week-ends. You should eat very light the first 24 hours after surgery, such as soup, crackers, pudding, etc.  Resume your normal diet the day after surgery. Most patients will experience some swelling and bruising in the breast.  Ice packs and a good support bra will help.  Swelling and bruising can take several days to resolve.  It is common to experience some constipation if taking pain medication after surgery.  Increasing fluid intake and taking a stool softener will usually help or prevent this problem from occurring.  A mild laxative (Milk of Magnesia or Miralax) should be taken according to package directions if there are no bowel movements after 48 hours. Unless discharge instructions indicate otherwise, you may remove your bandages 24-48 hours after surgery, and you may shower at that time.  You may have steri-strips (small skin tapes) in place directly over the incision.  These strips should be left on the skin for 7-10 days.  If your surgeon used skin glue on the incision, you may shower in 24 hours.  The glue will flake off over the next 2-3 weeks.  Any sutures or staples will be removed at the office  during your follow-up visit. ACTIVITIES:  You may resume regular daily activities (gradually increasing) beginning the next day.  Wearing a good support bra or sports bra minimizes pain and swelling.  You may have sexual intercourse when it is comfortable. You may drive when you no longer are taking prescription pain medication, you can comfortably wear a  seatbelt, and you can safely maneuver your car and apply brakes. RETURN TO WORK:  __IN ONE WEEK ON 01/26/2023 ____________________________________________________________________________________ Bonita Quin should see your doctor in the office for a follow-up appointment approximately two weeks after your surgery.  Your doctor's nurse will typically make your follow-up appointment when she calls you with your pathology report.  Expect your pathology report 2-3 business days after your surgery.  You may call to check if you do not hear from [place] after three days. OTHER INSTRUCTIONS: _YOU MAY SHOWER STARTING TOMORROW ICE PACK, TYLENOL, AND IBUPROFEN ALSO FOR PAIN _NO LIFTING MORE THAN 15 POUNDS FOR 4 WEEKS _____________________________________________________________________________________________ _____________________________________________________________________________________________________________________________________ _____________________________________________________________________________________________________________________________________ _____________________________________________________________________________________________________________________________________  WHEN TO CALL YOUR DOCTOR: Fever over 101.0 Nausea and/or vomiting. Extreme swelling or bruising. Continued bleeding from incision. Increased pain, redness, or drainage from the incision.  The clinic staff is available to answer your questions during regular business hours.  Please don't hesitate to call and ask to speak to one of the nurses for clinical concerns.  If you have a medical emergency, go to the nearest emergency room or call 911.  A surgeon from [facility] is always on call at the hospital.  For further questions, please visit [email or web address]

## 2023-01-20 ENCOUNTER — Encounter (HOSPITAL_BASED_OUTPATIENT_CLINIC_OR_DEPARTMENT_OTHER): Payer: Self-pay | Admitting: Surgery

## 2023-01-21 NOTE — Anesthesia Postprocedure Evaluation (Signed)
Anesthesia Post Note  Patient: Tracey Anderson  Procedure(s) Performed: OPEN UMBILICAL HERNIA REPAIR WITH MESH (Abdomen) INSERTION OF MESH (Abdomen)     Patient location during evaluation: PACU Anesthesia Type: General Level of consciousness: awake and alert Pain management: pain level controlled Vital Signs Assessment: post-procedure vital signs reviewed and stable Respiratory status: spontaneous breathing, nonlabored ventilation, respiratory function stable and patient connected to nasal cannula oxygen Cardiovascular status: blood pressure returned to baseline and stable Postop Assessment: no apparent nausea or vomiting Anesthetic complications: no  No notable events documented.  Last Vitals:  Vitals:   01/19/23 1415 01/19/23 1500  BP: 138/85 137/84  Pulse: 99 92  Resp: 14 16  Temp:  36.9 C  SpO2: 93% 93%    Last Pain:  Vitals:   01/20/23 0855  TempSrc:   PainSc: 3                  Chika Cichowski L Kyannah Climer

## 2023-02-09 DIAGNOSIS — M60819 Other myositis, unspecified shoulder: Secondary | ICD-10-CM | POA: Diagnosis not present

## 2023-02-13 DIAGNOSIS — K420 Umbilical hernia with obstruction, without gangrene: Secondary | ICD-10-CM | POA: Diagnosis not present

## 2023-02-15 ENCOUNTER — Other Ambulatory Visit: Payer: Self-pay | Admitting: Family Medicine

## 2023-02-18 DIAGNOSIS — M25512 Pain in left shoulder: Secondary | ICD-10-CM | POA: Diagnosis not present

## 2023-02-18 DIAGNOSIS — M5412 Radiculopathy, cervical region: Secondary | ICD-10-CM | POA: Diagnosis not present

## 2023-04-07 ENCOUNTER — Ambulatory Visit (INDEPENDENT_AMBULATORY_CARE_PROVIDER_SITE_OTHER): Payer: BC Managed Care – PPO | Admitting: Family Medicine

## 2023-04-07 VITALS — BP 132/78 | HR 112 | Temp 98.5°F | Ht 61.0 in | Wt 191.7 lb

## 2023-04-07 DIAGNOSIS — E1159 Type 2 diabetes mellitus with other circulatory complications: Secondary | ICD-10-CM | POA: Diagnosis not present

## 2023-04-07 DIAGNOSIS — E1165 Type 2 diabetes mellitus with hyperglycemia: Secondary | ICD-10-CM | POA: Diagnosis not present

## 2023-04-07 DIAGNOSIS — B3731 Acute candidiasis of vulva and vagina: Secondary | ICD-10-CM

## 2023-04-07 DIAGNOSIS — I152 Hypertension secondary to endocrine disorders: Secondary | ICD-10-CM

## 2023-04-07 DIAGNOSIS — Z7985 Long-term (current) use of injectable non-insulin antidiabetic drugs: Secondary | ICD-10-CM

## 2023-04-07 DIAGNOSIS — E785 Hyperlipidemia, unspecified: Secondary | ICD-10-CM | POA: Diagnosis not present

## 2023-04-07 LAB — POCT GLYCOSYLATED HEMOGLOBIN (HGB A1C): Hemoglobin A1C: 12.3 % — AB (ref 4.0–5.6)

## 2023-04-07 MED ORDER — FLUCONAZOLE 150 MG PO TABS
ORAL_TABLET | ORAL | 1 refills | Status: DC
Start: 2023-04-07 — End: 2023-07-10

## 2023-04-07 MED ORDER — TIRZEPATIDE 2.5 MG/0.5ML ~~LOC~~ SOAJ: 2.5000 mg | SUBCUTANEOUS | 1 refills | Status: DC

## 2023-04-07 NOTE — Patient Instructions (Signed)
Stop the Trulicity  Start the Mounjaro 2.5 mg [place] once weekly  Give me some follow up in about one month if tolerating well so we can increase to 5 mg   Set up labs for next week- Tuesday

## 2023-04-07 NOTE — Progress Notes (Signed)
Established Patient Office Visit  Subjective   Patient ID: Tracey Anderson, female    DOB: Nov 24, 1975  Age: 47 y.o. MRN: [id number]  No chief complaint on file.   HPI   Tracey Anderson is seen for medical follow up.  She has history of hypertension, type 2 diabetes, obesity, dyslipidemia.  Her diabetes been poorly controlled but she had run out of Trulicity and there was problems with availability and she has basically not been taking this regularly now for several months.  She also takes metformin extended release, atorvastatin, Cardizem.  She had recent umbilical hernia repair which went well with no complications.  Overdue for follow-up labs including lipids.  She is not fasting today.  She had some recent problems with yeast vaginitis.  has been treated by her GYN with nystatin cream but is had some resistant and persistent symptoms.  This likely reflects poorly controlled diabetes.  Past Medical History:  Diagnosis Date   Arthritis    Environmental allergies    Gestational diabetes    Hx gestational diabetes    Hypertension    Obesity    Past Surgical History:  Procedure Laterality Date   CHOLECYSTECTOMY  2009   CYSTECTOMY  2003   left breast   INSERTION OF MESH N/A 01/19/2023   Procedure: INSERTION OF MESH;  Surgeon: Abigail Miyamoto, MD;  Location: [facility];  Service: General;  Laterality: N/A;   UMBILICAL HERNIA REPAIR N/A 01/19/2023   Procedure: OPEN UMBILICAL HERNIA REPAIR WITH MESH;  Surgeon: Abigail Miyamoto, MD;  Location: [facility];  Service: General;  Laterality: N/A;  LMA    reports that she has never smoked. She has never used smokeless tobacco. She reports that she does not drink alcohol and does not use drugs. family history includes Diabetes in her maternal grandfather; Heart disease in her paternal grandfather; Hypertension in her maternal grandfather; Stroke in her maternal grandfather. Allergies  Allergen Reactions    Oxycodone-Acetaminophen Itching   Jardiance [Empagliflozin] Other (See Comments)    Yeast vaginitis   Oxycodone Itching    Review of Systems  Constitutional:  Negative for malaise/fatigue.  Eyes:  Negative for blurred vision.  Respiratory:  Negative for shortness of breath.   Cardiovascular:  Negative for chest pain.  Neurological:  Negative for dizziness, weakness and headaches.      Objective:     BP 132/78 (BP Location: Left Arm, Patient Position: Sitting, Cuff Size: Large)   Pulse (!) 112   Temp 98.5 F (36.9 C) (Oral)   Ht 5\' 1"  (1.549 m)   Wt 191 lb 11.2 oz (87 kg)   SpO2 98%   BMI 36.22 kg/m  BP Readings from Last 3 Encounters:  04/07/23 132/78  01/19/23 137/84  12/23/21 136/86   Wt Readings from Last 3 Encounters:  04/07/23 191 lb 11.2 oz (87 kg)  01/19/23 196 lb 13.9 oz (89.3 kg)  12/23/21 200 lb 1.6 oz (90.8 kg)      Physical Exam Vitals reviewed.  Constitutional:      Appearance: She is well-developed.  Eyes:     Pupils: Pupils are equal, round, and reactive to light.  Neck:     Thyroid: No thyromegaly.     Vascular: No JVD.  Cardiovascular:     Rate and Rhythm: Normal rate and regular rhythm.     Heart sounds:     No gallop.  Pulmonary:     Effort: Pulmonary effort is normal. No respiratory distress.  Breath sounds: Normal breath sounds. No wheezing or rales.  Musculoskeletal:     Cervical back: Neck supple.     Right lower leg: No edema.     Left lower leg: No edema.  Neurological:     Mental Status: She is alert.      Results for orders placed or performed in visit on 04/07/23  POC HgB A1c  Result Value Ref Range   Hemoglobin A1C 12.3 (A) 4.0 - 5.6 %   HbA1c POC (<> result, manual entry)     HbA1c, POC (prediabetic range)     HbA1c, POC (controlled diabetic range)      Last CBC Lab Results  Component Value Date   WBC 17.5 (H) 08/29/2021   HGB 14.1 08/29/2021   HCT 42.2 08/29/2021   MCV 84.9 08/29/2021   MCH 28.4  08/29/2021   RDW 12.3 08/29/2021   PLT 243 08/29/2021   Last metabolic panel Lab Results  Component Value Date   GLUCOSE 339 (H) 01/16/2023   NA 134 (L) 01/16/2023   K 4.2 01/16/2023   CL 100 01/16/2023   CO2 23 01/16/2023   BUN 8 01/16/2023   CREATININE 0.50 01/16/2023   GFRNONAA >60 01/16/2023   CALCIUM 8.9 01/16/2023   PROT 7.9 08/29/2021   ALBUMIN 4.1 08/29/2021   BILITOT 1.3 (H) 08/29/2021   ALKPHOS 74 08/29/2021   AST 22 08/29/2021   ALT 24 08/29/2021   ANIONGAP 11 01/16/2023   Last lipids Lab Results  Component Value Date   CHOL 151 09/13/2021   HDL 45.90 09/13/2021   LDLCALC (H) 01/15/2010    109        Total Cholesterol/HDL:CHD Risk Coronary Heart Disease Risk Table                     Men   Women  1/2 Average Risk   3.4   3.3  Average Risk       5.0   4.4  2 X Average Risk   9.6   7.1  3 X Average Risk  23.4   11.0        Use the calculated Patient Ratio above and the CHD Risk Table to determine the patient's CHD Risk.        ATP III CLASSIFICATION (LDL):  <100     mg/dL   Optimal  284-132  mg/dL   Near or Above                    Optimal  130-159  mg/dL   Borderline  440-102  mg/dL   High  >725     mg/dL   Very High   LDLDIRECT 83.0 09/13/2021   TRIG 311.0 (H) 09/13/2021   CHOLHDL 3 09/13/2021   Last hemoglobin A1c Lab Results  Component Value Date   HGBA1C 12.3 (A) 04/07/2023   Last thyroid functions Lab Results  Component Value Date   TSH 1.43 07/20/2019      The 10-year ASCVD risk score (Arnett DK, et al., 2019) is: 1.7%    Assessment & Plan:   Problem List Items Addressed This Visit       Unprioritized   Uncontrolled type 2 diabetes mellitus with hyperglycemia (HCC) - Primary   Relevant Medications   tirzepatide (MOUNJARO) 2.5 MG/0.5ML Pen   Other Relevant Orders   POC HgB A1c (Completed)   Microalbumin / creatinine urine ratio   Hypertension associated with diabetes (HCC)   Relevant  Medications   tirzepatide [facility])  2.5 MG/0.5ML Pen   Dyslipidemia   Relevant Orders   Lipid panel   CMP  Type 2 diabetes with very poor control with A1c 12.3%.  Recent poor compliance with Trulicity with availability issues.  -Recommend discontinue Trulicity -Start Mounjaro 2.5 mg subcutaneous once weekly and give feedback in 1 month.  Tolerating well at that time further titrate to 5 mg once weekly.  Set up 4-month follow-up  -Future lab order placed for lipid panel, CMP, and urine microalbumin screen for next week  -Fluconazole 150 mg 1 dose as needed for yeast vaginitis symptoms.  Hopefully these will improve as her glycemic control improves.  Return in about 3 months (around 07/08/2023).    Evelena Peat, MD

## 2023-04-15 ENCOUNTER — Other Ambulatory Visit (INDEPENDENT_AMBULATORY_CARE_PROVIDER_SITE_OTHER): Payer: BC Managed Care – PPO

## 2023-04-15 DIAGNOSIS — E1165 Type 2 diabetes mellitus with hyperglycemia: Secondary | ICD-10-CM

## 2023-04-15 DIAGNOSIS — E785 Hyperlipidemia, unspecified: Secondary | ICD-10-CM

## 2023-04-15 LAB — COMPREHENSIVE METABOLIC PANEL
ALT: 23 U/L (ref 0–35)
AST: 17 U/L (ref 0–37)
Albumin: 4.3 g/dL (ref 3.5–5.2)
Alkaline Phosphatase: 64 U/L (ref 39–117)
BUN: 11 mg/dL (ref 6–23)
CO2: 24 mEq/L (ref 19–32)
Calcium: 9.5 mg/dL (ref 8.4–10.5)
Chloride: 99 mEq/L (ref 96–112)
Creatinine, Ser: 0.55 mg/dL (ref 0.40–1.20)
GFR: 111.07 mL/min (ref >60.00)
Glucose, Bld: 187 mg/dL — ABNORMAL HIGH (ref 70–99)
Potassium: 4 mEq/L (ref 3.5–5.1)
Sodium: 133 mEq/L — ABNORMAL LOW (ref 135–145)
Total Bilirubin: 0.8 mg/dL (ref 0.2–1.2)
Total Protein: 7.3 g/dL (ref 6.0–8.3)

## 2023-04-15 LAB — LIPID PANEL
Cholesterol: 143 mg/dL (ref 0–200)
HDL: 53 mg/dL (ref >39.00)
NonHDL: 90.15
Total CHOL/HDL Ratio: 3
Triglycerides: 259 mg/dL — ABNORMAL HIGH (ref 0.0–149.0)
VLDL: 51.8 mg/dL — ABNORMAL HIGH (ref 0.0–40.0)

## 2023-04-15 LAB — MICROALBUMIN / CREATININE URINE RATIO
Creatinine,U: 96.4 mg/dL
Microalb Creat Ratio: 1.2 mg/g (ref 0.0–30.0)
Microalb, Ur: 1.1 mg/dL (ref 0.0–1.9)

## 2023-04-15 LAB — LDL CHOLESTEROL, DIRECT: Direct LDL: 75 mg/dL

## 2023-04-23 ENCOUNTER — Encounter: Payer: Self-pay | Admitting: Family Medicine

## 2023-04-24 MED ORDER — TIRZEPATIDE 5 MG/0.5ML ~~LOC~~ SOAJ: 5.0000 mg | SUBCUTANEOUS | 0 refills | Status: DC

## 2023-05-14 ENCOUNTER — Encounter: Payer: Self-pay | Admitting: Family Medicine

## 2023-05-15 MED ORDER — DEXCOM G7 SENSOR MISC: 2 refills | Status: DC

## 2023-05-23 ENCOUNTER — Other Ambulatory Visit: Payer: Self-pay | Admitting: Family Medicine

## 2023-05-25 NOTE — Telephone Encounter (Signed)
Rx sent and Patient has upcoming appt on 07/10/2023

## 2023-05-27 ENCOUNTER — Telehealth: Payer: Self-pay

## 2023-05-27 ENCOUNTER — Other Ambulatory Visit (HOSPITAL_COMMUNITY): Payer: Self-pay

## 2023-05-27 NOTE — Telephone Encounter (Signed)
Pharmacy Patient Advocate Encounter   Received notification from CoverMyMeds that prior authorization for Dexcom G7 Sensor is required/requested.   Insurance verification completed.   The patient is insured through [facility] .   Per test claim: PA required; PA submitted to [facility] via CoverMyMeds Key/confirmation #/EOC [id number] Status is pending

## 2023-06-01 MED ORDER — FREESTYLE LIBRE 3 SENSOR MISC: 2 refills | Status: DC

## 2023-06-01 NOTE — Telephone Encounter (Signed)
Patient is aware of denial

## 2023-06-01 NOTE — Telephone Encounter (Signed)
Pharmacy Patient Advocate Encounter  Received notification from [facility] that Prior Authorization for Dexcom G7 Sensor has been DENIED.  See denial reason below. No denial letter attached in CMM. Will attache denial letter to Media tab once received.   PA #/Case ID/Reference #: [id number]

## 2023-06-01 NOTE — Addendum Note (Signed)
Addended by: Christy Sartorius on: 06/01/2023 01:17 PM   Modules accepted: Orders

## 2023-06-10 ENCOUNTER — Other Ambulatory Visit: Payer: Self-pay | Admitting: Family Medicine

## 2023-06-22 ENCOUNTER — Encounter: Payer: Self-pay | Admitting: Family Medicine

## 2023-06-23 MED ORDER — TIRZEPATIDE 7.5 MG/0.5ML ~~LOC~~ SOAJ: 7.5000 mg | SUBCUTANEOUS | 0 refills | Status: DC

## 2023-06-24 DIAGNOSIS — Z6836 Body mass index (BMI) 36.0-36.9, adult: Secondary | ICD-10-CM | POA: Diagnosis not present

## 2023-06-24 DIAGNOSIS — Z01419 Encounter for gynecological examination (general) (routine) without abnormal findings: Secondary | ICD-10-CM | POA: Diagnosis not present

## 2023-06-29 DIAGNOSIS — Z1231 Encounter for screening mammogram for malignant neoplasm of breast: Secondary | ICD-10-CM | POA: Diagnosis not present

## 2023-07-10 ENCOUNTER — Ambulatory Visit (INDEPENDENT_AMBULATORY_CARE_PROVIDER_SITE_OTHER): Payer: BC Managed Care – PPO | Admitting: Family Medicine

## 2023-07-10 VITALS — BP 148/98 | HR 96 | Temp 97.6°F | Ht 61.0 in | Wt 196.5 lb

## 2023-07-10 DIAGNOSIS — B3731 Acute candidiasis of vulva and vagina: Secondary | ICD-10-CM

## 2023-07-10 DIAGNOSIS — E1165 Type 2 diabetes mellitus with hyperglycemia: Secondary | ICD-10-CM

## 2023-07-10 DIAGNOSIS — E785 Hyperlipidemia, unspecified: Secondary | ICD-10-CM

## 2023-07-10 DIAGNOSIS — Z7985 Long-term (current) use of injectable non-insulin antidiabetic drugs: Secondary | ICD-10-CM | POA: Diagnosis not present

## 2023-07-10 DIAGNOSIS — E1159 Type 2 diabetes mellitus with other circulatory complications: Secondary | ICD-10-CM

## 2023-07-10 DIAGNOSIS — I152 Hypertension secondary to endocrine disorders: Secondary | ICD-10-CM

## 2023-07-10 LAB — POCT GLYCOSYLATED HEMOGLOBIN (HGB A1C): Hemoglobin A1C: 9.8 % — AB (ref 4.0–5.6)

## 2023-07-10 MED ORDER — VALSARTAN 80 MG PO TABS
80.0000 mg | ORAL_TABLET | Freq: Every day | ORAL | 3 refills | Status: DC
Start: 2023-07-10 — End: 2024-04-27

## 2023-07-10 MED ORDER — FLUCONAZOLE 150 MG PO TABS: ORAL_TABLET | ORAL | 1 refills | Status: DC

## 2023-07-10 NOTE — Progress Notes (Signed)
Established Patient Office Visit  Subjective   Patient ID: Tracey Anderson, female    DOB: 1976-05-06  Age: 47 y.o. MRN: [id number]  No chief complaint on file.   HPI   Tracey Anderson is seen for medical follow-up.  She has history of type 2 diabetes, hypertension, hyperlipidemia, obesity.  She currently is on Mounjaro 7.5 mg once weekly and just went on this a few weeks ago.  She is only had 2 doses at that milligram.  Tolerating fairly well.  Has had several blood sugars over 200.  Her last A1c was 12.3% prior to starting [facility].  She also remains on metformin.  Her other medications include diltiazem and atorvastatin.  She had lipids back in the summer which were fairly well-controlled with LDL cholesterol 75.  She has had recurrent yeast vaginitis.  Previously cannot take Jardiance.  Saw GYN recently and prescribed Terazol topical but has not helped much.  She has responded better to fluconazole in the past and requesting refill.  No recent antibiotics.  She has had several elevated blood pressure readings including recently 144 systolic at GYN.  No headaches.  Compliant with diltiazem.  Past Medical History:  Diagnosis Date   Arthritis    Environmental allergies    Gestational diabetes    Hx gestational diabetes    Hypertension    Obesity    Past Surgical History:  Procedure Laterality Date   CHOLECYSTECTOMY  2009   CYSTECTOMY  2003   left breast   INSERTION OF MESH N/A 01/19/2023   Procedure: INSERTION OF MESH;  Surgeon: Abigail Miyamoto, MD;  Location: [facility];  Service: General;  Laterality: N/A;   UMBILICAL HERNIA REPAIR N/A 01/19/2023   Procedure: OPEN UMBILICAL HERNIA REPAIR WITH MESH;  Surgeon: Abigail Miyamoto, MD;  Location: [facility];  Service: General;  Laterality: N/A;  LMA    reports that she has never smoked. She has never used smokeless tobacco. She reports that she does not drink alcohol and does not use drugs. family history  includes Diabetes in her maternal grandfather; Heart disease in her paternal grandfather; Hypertension in her maternal grandfather; Stroke in her maternal grandfather. Allergies  Allergen Reactions   Oxycodone-Acetaminophen Itching   Jardiance [Empagliflozin] Other (See Comments)    Yeast vaginitis   Oxycodone Itching    Review of Systems  Constitutional:  Negative for malaise/fatigue.  Eyes:  Negative for blurred vision.  Respiratory:  Negative for shortness of breath.   Cardiovascular:  Negative for chest pain.  Genitourinary:  Negative for dysuria.  Neurological:  Negative for dizziness, weakness and headaches.      Objective:     BP (!) 148/98 (BP Location: Right Arm, Cuff Size: Normal)   Pulse 96   Temp 97.6 F (36.4 C) (Oral)   Ht 5\' 1"  (1.549 m)   Wt 196 lb 8 oz (89.1 kg)   SpO2 99%   BMI 37.13 kg/m  BP Readings from Last 3 Encounters:  07/10/23 (!) 148/98  04/07/23 132/78  01/19/23 137/84   Wt Readings from Last 3 Encounters:  07/10/23 196 lb 8 oz (89.1 kg)  04/07/23 191 lb 11.2 oz (87 kg)  01/19/23 196 lb 13.9 oz (89.3 kg)      Physical Exam Vitals reviewed.  Constitutional:      Appearance: She is well-developed.  Eyes:     Pupils: Pupils are equal, round, and reactive to light.  Neck:     Thyroid: No thyromegaly.  Vascular: No JVD.  Cardiovascular:     Rate and Rhythm: Normal rate and regular rhythm.     Heart sounds:     No gallop.  Pulmonary:     Effort: Pulmonary effort is normal. No respiratory distress.     Breath sounds: Normal breath sounds. No wheezing or rales.  Musculoskeletal:     Cervical back: Neck supple.  Neurological:     Mental Status: She is alert.      Results for orders placed or performed in visit on 07/10/23  POC HgB A1c  Result Value Ref Range   Hemoglobin A1C 9.8 (A) 4.0 - 5.6 %   HbA1c POC (<> result, manual entry)     HbA1c, POC (prediabetic range)     HbA1c, POC (controlled diabetic range)      Last  CBC Lab Results  Component Value Date   WBC 17.5 (H) 08/29/2021   HGB 14.1 08/29/2021   HCT 42.2 08/29/2021   MCV 84.9 08/29/2021   MCH 28.4 08/29/2021   RDW 12.3 08/29/2021   PLT 243 08/29/2021   Last metabolic panel Lab Results  Component Value Date   GLUCOSE 187 (H) 04/15/2023   NA 133 (L) 04/15/2023   K 4.0 04/15/2023   CL 99 04/15/2023   CO2 24 04/15/2023   BUN 11 04/15/2023   CREATININE 0.55 04/15/2023   GFR 111.07 04/15/2023   CALCIUM 9.5 04/15/2023   PROT 7.3 04/15/2023   ALBUMIN 4.3 04/15/2023   BILITOT 0.8 04/15/2023   ALKPHOS 64 04/15/2023   AST 17 04/15/2023   ALT 23 04/15/2023   ANIONGAP 11 01/16/2023   Last lipids Lab Results  Component Value Date   CHOL 143 04/15/2023   HDL 53.00 04/15/2023   LDLCALC (H) 01/15/2010    109        Total Cholesterol/HDL:CHD Risk Coronary Heart Disease Risk Table                     Men   Women  1/2 Average Risk   3.4   3.3  Average Risk       5.0   4.4  2 X Average Risk   9.6   7.1  3 X Average Risk  23.4   11.0        Use the calculated Patient Ratio above and the CHD Risk Table to determine the patient's CHD Risk.        ATP III CLASSIFICATION (LDL):  <100     mg/dL   Optimal  161-096  mg/dL   Near or Above                    Optimal  130-159  mg/dL   Borderline  045-409  mg/dL   High  >811     mg/dL   Very High   LDLDIRECT 75.0 04/15/2023   TRIG 259.0 (H) 04/15/2023   CHOLHDL 3 04/15/2023   Last hemoglobin A1c Lab Results  Component Value Date   HGBA1C 9.8 (A) 07/10/2023      The 10-year ASCVD risk score (Arnett DK, et al., 2019) is: 1.7%    Assessment & Plan:   #1 type 2 diabetes poorly controlled with A1c today 9.8%.  This is improved though from 12.3% previously.  Currently on metformin and Mounjaro and only 2 weeks of 7.5 mg dose. -Recommend continue weight loss efforts -Continue current dose of Mounjaro.  If she is not seeing consistent readings less than  200 over the next few weeks be in  touch and we will titrate further to 10 mg and set up 62-month follow-up  #2 hypertension suboptimally controlled.  Add Diovan 80 mg once daily to her Cardizem.  Stressed importance of low-sodium diet.  #3 hyperlipidemia.  Patient on atorvastatin 20 mg daily.  Recent LDL cholesterol 75.  Continue low saturated fat diet  #4 recurrent yeast vaginitis symptoms.  Did not respond to topical.  Send in fluconazole 150 mg x 1 dose   No follow-ups on file.    Evelena Peat, MD

## 2023-07-10 NOTE — Patient Instructions (Addendum)
A1C today was 9.8%  Let me know in 3-4 weeks if sugars not more consistently < 200  Start the Valsartan for hypertension  Set up 3 month follow up.

## 2023-07-13 DIAGNOSIS — N83201 Unspecified ovarian cyst, right side: Secondary | ICD-10-CM | POA: Diagnosis not present

## 2023-07-13 DIAGNOSIS — N920 Excessive and frequent menstruation with regular cycle: Secondary | ICD-10-CM | POA: Diagnosis not present

## 2023-07-14 ENCOUNTER — Other Ambulatory Visit: Payer: Self-pay | Admitting: Obstetrics and Gynecology

## 2023-07-14 DIAGNOSIS — N858 Other specified noninflammatory disorders of uterus: Secondary | ICD-10-CM

## 2023-07-30 ENCOUNTER — Ambulatory Visit
Admission: RE | Admit: 2023-07-30 | Discharge: 2023-07-30 | Disposition: A | Discharge status: Home / Self Care | Payer: BC Managed Care – PPO | Source: Ambulatory Visit | Attending: Obstetrics and Gynecology | Admitting: Obstetrics and Gynecology

## 2023-07-30 DIAGNOSIS — N858 Other specified noninflammatory disorders of uterus: Secondary | ICD-10-CM

## 2023-07-30 DIAGNOSIS — D259 Leiomyoma of uterus, unspecified: Secondary | ICD-10-CM | POA: Diagnosis not present

## 2023-07-30 MED ORDER — GADOPICLENOL 0.5 MMOL/ML IV SOLN
9.0000 mL | Freq: Once | INTRAVENOUS | Status: AC | PRN
Start: 2023-07-30 — End: 2023-07-30
  Administered 2023-07-30: 9 mL via INTRAVENOUS

## 2023-08-21 ENCOUNTER — Encounter: Payer: Self-pay | Admitting: Family Medicine

## 2023-08-28 ENCOUNTER — Encounter: Payer: Self-pay | Admitting: Family Medicine

## 2023-08-28 MED ORDER — TIRZEPATIDE 10 MG/0.5ML ~~LOC~~ SOAJ: 10.0000 mg | SUBCUTANEOUS | 0 refills | Status: DC

## 2023-08-28 MED ORDER — FLUCONAZOLE 150 MG PO TABS
150.0000 mg | ORAL_TABLET | Freq: Once | ORAL | 0 refills | Status: AC
Start: 2023-08-28 — End: 2023-08-28

## 2023-09-12 ENCOUNTER — Other Ambulatory Visit: Payer: Self-pay | Admitting: Family Medicine

## 2023-09-21 DIAGNOSIS — R319 Hematuria, unspecified: Secondary | ICD-10-CM | POA: Diagnosis not present

## 2023-09-21 DIAGNOSIS — N907 Vulvar cyst: Secondary | ICD-10-CM | POA: Diagnosis not present

## 2023-09-21 DIAGNOSIS — N9489 Other specified conditions associated with female genital organs and menstrual cycle: Secondary | ICD-10-CM | POA: Diagnosis not present

## 2023-09-21 DIAGNOSIS — M545 Low back pain, unspecified: Secondary | ICD-10-CM | POA: Diagnosis not present

## 2023-09-29 ENCOUNTER — Other Ambulatory Visit: Payer: Self-pay | Admitting: Family Medicine

## 2023-09-29 ENCOUNTER — Encounter: Payer: Self-pay | Admitting: Family Medicine

## 2023-09-29 MED ORDER — FLUCONAZOLE 150 MG PO TABS: 150.0000 mg | ORAL_TABLET | Freq: Once | ORAL | 0 refills | Status: AC

## 2023-09-29 NOTE — Telephone Encounter (Signed)
Medication has been sent patient is aware.

## 2023-10-09 ENCOUNTER — Ambulatory Visit: Payer: BC Managed Care – PPO | Admitting: Family Medicine

## 2023-10-13 DIAGNOSIS — R1900 Intra-abdominal and pelvic swelling, mass and lump, unspecified site: Secondary | ICD-10-CM | POA: Diagnosis not present

## 2023-10-13 DIAGNOSIS — N390 Urinary tract infection, site not specified: Secondary | ICD-10-CM | POA: Diagnosis not present

## 2023-11-02 ENCOUNTER — Encounter: Payer: Self-pay | Admitting: Family Medicine

## 2023-11-06 ENCOUNTER — Ambulatory Visit: Payer: BC Managed Care – PPO | Admitting: Family Medicine

## 2023-11-06 ENCOUNTER — Encounter: Payer: Self-pay | Admitting: Family Medicine

## 2023-11-06 VITALS — BP 138/88 | HR 102 | Temp 98.3°F | Wt 187.7 lb

## 2023-11-06 DIAGNOSIS — Z7985 Long-term (current) use of injectable non-insulin antidiabetic drugs: Secondary | ICD-10-CM

## 2023-11-06 DIAGNOSIS — L309 Dermatitis, unspecified: Secondary | ICD-10-CM | POA: Diagnosis not present

## 2023-11-06 DIAGNOSIS — I152 Hypertension secondary to endocrine disorders: Secondary | ICD-10-CM

## 2023-11-06 DIAGNOSIS — E1165 Type 2 diabetes mellitus with hyperglycemia: Secondary | ICD-10-CM

## 2023-11-06 DIAGNOSIS — Z7984 Long term (current) use of oral hypoglycemic drugs: Secondary | ICD-10-CM

## 2023-11-06 DIAGNOSIS — E1159 Type 2 diabetes mellitus with other circulatory complications: Secondary | ICD-10-CM

## 2023-11-06 DIAGNOSIS — E785 Hyperlipidemia, unspecified: Secondary | ICD-10-CM | POA: Diagnosis not present

## 2023-11-06 LAB — POCT GLYCOSYLATED HEMOGLOBIN (HGB A1C): Hemoglobin A1C: 9.2 % — AB (ref 4.0–5.6)

## 2023-11-06 MED ORDER — TIRZEPATIDE 12.5 MG/0.5ML ~~LOC~~ SOAJ: 12.5000 mg | SUBCUTANEOUS | 3 refills | Status: DC

## 2023-11-06 MED ORDER — TRIAMCINOLONE ACETONIDE 0.1 % EX CREA: 1.0000 | TOPICAL_CREAM | Freq: Two times a day (BID) | CUTANEOUS | 1 refills | Status: AC

## 2023-11-06 NOTE — Patient Instructions (Addendum)
 A1C still up at 9.2%- but improved.   Increase the Moujaro to 12.5 mg once weekly  Be sure to set up 3 month follow up.

## 2023-11-06 NOTE — Progress Notes (Signed)
 Established Patient Office Visit  Subjective   Patient ID: Tracey Anderson, female    DOB: 1975/11/19  Age: 48 y.o. MRN: [id number]  No chief complaint on file.   HPI   Tracey Anderson is seen for medical follow-up.  She has history of obesity, hypertension, type 2 diabetes, hyperlipidemia.  Diabetes has been poorly controlled.  We have started [facility] several months ago currently 10 mg once weekly.  She states she is compliant with therapy.  Also takes metformin extended release.  She has continuous glucose monitor.  Does not bring in the graph of readings today.  Blood pressure treated with diltiazem CD and Diovan.  She takes atorvastatin 20 mg daily for hyperlipidemia.  Her A1c last summer was 12.3 and down to 9.8 last visit and 9.2 today.  She states she had eye exam on February 8.  Denies any neuropathy symptoms.  Also has new issue of left wrist rash.  First noted a couple weeks ago.  Scaly and slightly pruritic.  No other rashes reported.  Has not tried any creams.  Past Medical History:  Diagnosis Date   Arthritis    Environmental allergies    Gestational diabetes    Hx gestational diabetes    Hypertension    Obesity    Past Surgical History:  Procedure Laterality Date   CHOLECYSTECTOMY  2009   CYSTECTOMY  2003   left breast   INSERTION OF MESH N/A 01/19/2023   Procedure: INSERTION OF MESH;  Surgeon: Abigail Miyamoto, MD;  Location: [facility];  Service: General;  Laterality: N/A;   UMBILICAL HERNIA REPAIR N/A 01/19/2023   Procedure: OPEN UMBILICAL HERNIA REPAIR WITH MESH;  Surgeon: Abigail Miyamoto, MD;  Location: [facility];  Service: General;  Laterality: N/A;  LMA    reports that she has never smoked. She has never used smokeless tobacco. She reports that she does not drink alcohol and does not use drugs. family history includes Diabetes in her maternal grandfather; Heart disease in her paternal grandfather; Hypertension in her maternal  grandfather; Stroke in her maternal grandfather. Allergies  Allergen Reactions   Oxycodone-Acetaminophen Itching   Jardiance [Empagliflozin] Other (See Comments)    Yeast vaginitis   Oxycodone Itching    Review of Systems  Constitutional:  Negative for chills, fever and malaise/fatigue.  Eyes:  Negative for blurred vision.  Respiratory:  Negative for shortness of breath.   Cardiovascular:  Negative for chest pain.  Skin:  Positive for rash.  Neurological:  Negative for dizziness, weakness and headaches.      Objective:     BP 138/88 (BP Location: Left Arm, Cuff Size: Normal)   Pulse (!) 102   Temp 98.3 F (36.8 C) (Oral)   Wt 187 lb 11.2 oz (85.1 kg)   SpO2 99%   BMI 35.47 kg/m  BP Readings from Last 3 Encounters:  11/06/23 138/88  07/10/23 (!) 148/98  04/07/23 132/78   Wt Readings from Last 3 Encounters:  11/06/23 187 lb 11.2 oz (85.1 kg)  07/10/23 196 lb 8 oz (89.1 kg)  04/07/23 191 lb 11.2 oz (87 kg)      Physical Exam Vitals reviewed.  Constitutional:      Appearance: She is well-developed.  Eyes:     Pupils: Pupils are equal, round, and reactive to light.  Neck:     Thyroid: No thyromegaly.     Vascular: No JVD.  Cardiovascular:     Rate and Rhythm: Normal rate and regular  rhythm.     Heart sounds:     No gallop.  Pulmonary:     Effort: Pulmonary effort is normal. No respiratory distress.     Breath sounds: Normal breath sounds. No wheezing or rales.  Musculoskeletal:     Cervical back: Neck supple.  Skin:    Findings: Rash present.     Comments: Left wrist ulnar surface reveals approximately 1 cm rash with erythematous base and diffuse scaly surface.  No elevated border.  No clearing in the center.  Neurological:     Mental Status: She is alert.      Results for orders placed or performed in visit on 11/06/23  POC HgB A1c  Result Value Ref Range   Hemoglobin A1C 9.2 (A) 4.0 - 5.6 %   HbA1c POC (<> result, manual entry)     HbA1c, POC  (prediabetic range)     HbA1c, POC (controlled diabetic range)      Last CBC Lab Results  Component Value Date   WBC 17.5 (H) 08/29/2021   HGB 14.1 08/29/2021   HCT 42.2 08/29/2021   MCV 84.9 08/29/2021   MCH 28.4 08/29/2021   RDW 12.3 08/29/2021   PLT 243 08/29/2021   Last metabolic panel Lab Results  Component Value Date   GLUCOSE 187 (H) 04/15/2023   NA 133 (L) 04/15/2023   K 4.0 04/15/2023   CL 99 04/15/2023   CO2 24 04/15/2023   BUN 11 04/15/2023   CREATININE 0.55 04/15/2023   GFR 111.07 04/15/2023   CALCIUM 9.5 04/15/2023   PROT 7.3 04/15/2023   ALBUMIN 4.3 04/15/2023   BILITOT 0.8 04/15/2023   ALKPHOS 64 04/15/2023   AST 17 04/15/2023   ALT 23 04/15/2023   ANIONGAP 11 01/16/2023   Last lipids Lab Results  Component Value Date   CHOL 143 04/15/2023   HDL 53.00 04/15/2023   LDLCALC (H) 01/15/2010    109        Total Cholesterol/HDL:CHD Risk Coronary Heart Disease Risk Table                     Men   Women  1/2 Average Risk   3.4   3.3  Average Risk       5.0   4.4  2 X Average Risk   9.6   7.1  3 X Average Risk  23.4   11.0        Use the calculated Patient Ratio above and the CHD Risk Table to determine the patient's CHD Risk.        ATP III CLASSIFICATION (LDL):  <100     mg/dL   Optimal  865-784  mg/dL   Near or Above                    Optimal  130-159  mg/dL   Borderline  696-295  mg/dL   High  >284     mg/dL   Very High   LDLDIRECT 75.0 04/15/2023   TRIG 259.0 (H) 04/15/2023   CHOLHDL 3 04/15/2023   Last hemoglobin A1c Lab Results  Component Value Date   HGBA1C 9.2 (A) 11/06/2023   Last thyroid functions Lab Results  Component Value Date   TSH 1.43 07/20/2019      The 10-year ASCVD risk score (Arnett DK, et al., 2019) is: 1.5%    Assessment & Plan:   #1 poorly controlled type 2 diabetes.  Surprisingly, her A1c only came down  from 9.8-9.2 on current dose of Mounjaro 10 mg once weekly.  She does take metformin extended release  as well.  Titrate Mounjaro further to 12.5 mg once weekly.  Set up 18-month follow-up. She had recent urine microalbumin screen which was normal  #2 hypertension.  Up a bit today on combination therapy with diltiazem and Diovan.  We decided to wait with changing medications since we anticipate some additional weight loss with Mounjaro.  If still up in 3 months consider further titration of medications  #3 hyperlipidemia treated with atorvastatin 20 mg daily.  Recent cholesterol 143 with LDL 75.  Continue current dose of atorvastatin.  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  Triamcinolone 0.1% cream to use twice daily as needed.  Be in touch if not clearing next couple weeks     Evelena Peat, MD

## 2023-12-22 ENCOUNTER — Encounter: Payer: Self-pay | Admitting: Family Medicine

## 2023-12-28 ENCOUNTER — Encounter: Payer: Self-pay | Admitting: Family Medicine

## 2023-12-28 ENCOUNTER — Other Ambulatory Visit: Payer: Self-pay | Admitting: Family Medicine

## 2023-12-28 MED ORDER — FREESTYLE LIBRE 3 PLUS SENSOR MISC: 2 refills | Status: AC

## 2024-01-26 ENCOUNTER — Ambulatory Visit (INDEPENDENT_AMBULATORY_CARE_PROVIDER_SITE_OTHER): Admitting: Family Medicine

## 2024-01-26 ENCOUNTER — Encounter: Payer: Self-pay | Admitting: Family Medicine

## 2024-01-26 VITALS — BP 142/90 | HR 90 | Temp 98.7°F | Wt 187.3 lb

## 2024-01-26 DIAGNOSIS — E1159 Type 2 diabetes mellitus with other circulatory complications: Secondary | ICD-10-CM

## 2024-01-26 DIAGNOSIS — I152 Hypertension secondary to endocrine disorders: Secondary | ICD-10-CM

## 2024-01-26 DIAGNOSIS — E1165 Type 2 diabetes mellitus with hyperglycemia: Secondary | ICD-10-CM | POA: Diagnosis not present

## 2024-01-26 DIAGNOSIS — F339 Major depressive disorder, recurrent, unspecified: Secondary | ICD-10-CM

## 2024-01-26 DIAGNOSIS — E785 Hyperlipidemia, unspecified: Secondary | ICD-10-CM

## 2024-01-26 DIAGNOSIS — Z7985 Long-term (current) use of injectable non-insulin antidiabetic drugs: Secondary | ICD-10-CM

## 2024-01-26 LAB — POCT GLYCOSYLATED HEMOGLOBIN (HGB A1C): Hemoglobin A1C: 8.9 % — AB (ref 4.0–5.6)

## 2024-01-26 MED ORDER — SERTRALINE HCL 50 MG PO TABS: 50.0000 mg | ORAL_TABLET | Freq: Every day | ORAL | 3 refills | Status: DC

## 2024-01-26 MED ORDER — TIRZEPATIDE 15 MG/0.5ML ~~LOC~~ SOAJ: 15.0000 mg | SUBCUTANEOUS | 3 refills | Status: AC

## 2024-01-26 MED ORDER — DILTIAZEM HCL ER COATED BEADS 240 MG PO CP24: 240.0000 mg | ORAL_CAPSULE | Freq: Every day | ORAL | 3 refills | Status: AC

## 2024-01-26 NOTE — Progress Notes (Signed)
 Established Patient Office Visit  Subjective   Patient ID: Tracey Anderson, female    DOB: Feb 16, 1976  Age: 48 y.o. MRN: [id number]  No chief complaint on file.   HPI   Tracey Anderson is seen today for medical follow-up.  Very stressful past few months.  Her father-in-law just died recently.  She also has 2 daughters who had some behavioral issues at times.  She feels like she may be stress eating at times.  She is also very concerned about depression symptoms building for months.  No suicidal ideation.  1 prior bout with depression and she thinks she was treated with sertraline and did well with that.  She has frequent crying spells, sad mood, loss of motivation, difficulty concentrating, anhedonia.  Diabetes been poorly controlled.  Slowly improving on Mounjaro .  Currently 12.5 mg once weekly.  Tolerating well.  Also remains on metformin .  Previous intolerance with Jardiance  with yeast infection.  Hypertension treated with Cardizem  120 mg and valsartan  80 mg.  She states she is compliant with therapy.  No recent chest pains.  She takes Lipitor for hyperlipidemia.  No myalgias.  Past Medical History:  Diagnosis Date   Arthritis    Environmental allergies    Gestational diabetes    Hx gestational diabetes    Hypertension    Obesity    Past Surgical History:  Procedure Laterality Date   CHOLECYSTECTOMY  2009   CYSTECTOMY  2003   left breast   INSERTION OF MESH N/A 01/19/2023   Procedure: INSERTION OF MESH;  Surgeon: Oza Blumenthal, MD;  Location: [facility];  Service: General;  Laterality: N/A;   UMBILICAL HERNIA REPAIR N/A 01/19/2023   Procedure: OPEN UMBILICAL HERNIA REPAIR WITH MESH;  Surgeon: Oza Blumenthal, MD;  Location: [facility];  Service: General;  Laterality: N/A;  LMA    reports that she has never smoked. She has never used smokeless tobacco. She reports that she does not drink alcohol and does not use drugs. family history includes Diabetes  in her maternal grandfather; Heart disease in her paternal grandfather; Hypertension in her maternal grandfather; Stroke in her maternal grandfather. Allergies  Allergen Reactions   Oxycodone -Acetaminophen  Itching   Jardiance  [Empagliflozin ] Other (See Comments)    Yeast vaginitis   Oxycodone  Itching    Review of Systems  Constitutional:  Negative for malaise/fatigue.  Eyes:  Negative for blurred vision.  Respiratory:  Negative for shortness of breath.   Cardiovascular:  Negative for chest pain.  Gastrointestinal:  Negative for abdominal pain.  Neurological:  Negative for dizziness, weakness and headaches.  Psychiatric/Behavioral:  Positive for depression. Negative for suicidal ideas.       Objective:      BP (!) 142/90 (BP Location: Right Arm, Patient Position: Sitting, Cuff Size: Normal)   Pulse 90   Temp 98.7 F (37.1 C) (Oral)   Wt 187 lb 4.8 oz (85 kg)   SpO2 98%   BMI 35.39 kg/m  BP Readings from Last 3 Encounters:  01/26/24 (!) 142/90  11/06/23 138/88  07/10/23 (!) 148/98   Wt Readings from Last 3 Encounters:  01/26/24 187 lb 4.8 oz (85 kg)  11/06/23 187 lb 11.2 oz (85.1 kg)  07/10/23 196 lb 8 oz (89.1 kg)      Physical Exam Vitals reviewed.  Constitutional:      Appearance: She is well-developed.  Eyes:     Pupils: Pupils are equal, round, and reactive to light.  Neck:  Thyroid : No thyromegaly.     Vascular: No JVD.  Cardiovascular:     Rate and Rhythm: Normal rate and regular rhythm.     Heart sounds:     No gallop.  Pulmonary:     Effort: Pulmonary effort is normal. No respiratory distress.     Breath sounds: Normal breath sounds. No wheezing or rales.  Musculoskeletal:     Cervical back: Neck supple.     Right lower leg: No edema.     Left lower leg: No edema.  Neurological:     Mental Status: She is alert.      Results for orders placed or performed in visit on 01/26/24  POC HgB A1c  Result Value Ref Range   Hemoglobin A1C 8.9  (A) 4.0 - 5.6 %   HbA1c POC (<> result, manual entry)     HbA1c, POC (prediabetic range)     HbA1c, POC (controlled diabetic range)        The 10-year ASCVD risk score (Arnett DK, et al., 2019) is: 1.6%    Assessment & Plan:   #1   Type 2 diabetes poorly controlled with A1c 8.9%.  This is slightly improved from last visit but surprisingly not down much in spite of Mounjaro  12.5 mg once weekly.  Previous intolerance with Jardiance .  Increase Mounjaro  to 15 mg.  Set up 88-month follow-up.  If not to goal at that point consider additional medication.  Discussed diet in some detail.  Try to establish more regular exercise such as walking  #2 hypertension suboptimally controlled.  Hopefully will improve further with weight loss.  Watch sodium intake.  Bump Cardizem  CD up to 240 mg daily.  If not to goal at next visit consider increasing Diovan  or possibly switching to Diovan  hydrochlorothiazide  #3 hyperlipidemia treated with Lipitor.  Recent lipids stable.  #4 major depressive episode.  Patient has been treated previously with sertraline.  Tolerated well by her recollection.  Initiate sertraline 50 mg daily.  Give me some feedback in 3 to 4 weeks regarding how she is doing   Return in about 3 months (around 04/27/2024).    Glean Lamy, MD

## 2024-01-26 NOTE — Patient Instructions (Signed)
 Increase the Mounjaro  to 15 mg once weekly  Increase the Cardizem  to 240 mg daily  Start the sertraline for depression and give me some feedback in about one month.

## 2024-02-02 DIAGNOSIS — N83201 Unspecified ovarian cyst, right side: Secondary | ICD-10-CM | POA: Diagnosis not present

## 2024-02-03 ENCOUNTER — Ambulatory Visit: Payer: BC Managed Care – PPO | Admitting: Family Medicine

## 2024-02-04 ENCOUNTER — Telehealth: Payer: Self-pay

## 2024-02-04 NOTE — Telephone Encounter (Signed)
 Spoke with the patient regarding the referral to GYN oncology. Patient scheduled as new patient with Dr Gaylin Ke on 02/17/2024. Patient given an arrival time of 10:00am.  Explained to the patient the the doctor will perform a pelvic exam at this visit. Patient given the policy that only one visitor allowed and that visitor must be over 16 yrs are allowed in the Cancer Center. Patient given the address/phone number for the clinic and that the center offers free valet service. Patient aware that masks required.

## 2024-02-16 ENCOUNTER — Encounter: Payer: Self-pay | Admitting: Obstetrics & Gynecology

## 2024-02-17 ENCOUNTER — Encounter: Payer: Self-pay | Admitting: Obstetrics & Gynecology

## 2024-02-17 ENCOUNTER — Encounter: Payer: Self-pay | Admitting: Family Medicine

## 2024-02-17 ENCOUNTER — Other Ambulatory Visit: Payer: Self-pay | Admitting: Family Medicine

## 2024-02-17 ENCOUNTER — Inpatient Hospital Stay: Attending: Obstetrics & Gynecology | Admitting: Obstetrics & Gynecology

## 2024-02-17 VITALS — BP 137/74 | HR 97 | Temp 98.3°F | Resp 17 | Ht 62.0 in | Wt 185.0 lb

## 2024-02-17 DIAGNOSIS — R140 Abdominal distension (gaseous): Secondary | ICD-10-CM | POA: Insufficient documentation

## 2024-02-17 DIAGNOSIS — D3911 Neoplasm of uncertain behavior of right ovary: Secondary | ICD-10-CM | POA: Diagnosis not present

## 2024-02-17 DIAGNOSIS — Z803 Family history of malignant neoplasm of breast: Secondary | ICD-10-CM | POA: Diagnosis not present

## 2024-02-17 DIAGNOSIS — N83209 Unspecified ovarian cyst, unspecified side: Secondary | ICD-10-CM

## 2024-02-17 DIAGNOSIS — N920 Excessive and frequent menstruation with regular cycle: Secondary | ICD-10-CM | POA: Diagnosis not present

## 2024-02-17 NOTE — Progress Notes (Signed)
   Follow Up Note: Gyn-Onc  Tracey Anderson 48 y.o. female  CC: Adnexal mass, increasing in size   HPI: Discussed the use of AI scribe software for clinical note transcription with the patient, who gave verbal consent to proceed.  History of Present Illness Tracey Anderson is a 48 year old female who presents with concerns about a small adnexal lesion and heavy menstrual bleeding. She is accompanied by her mother.  She has been experiencing heavy menstrual bleeding, which initially led to an ultrasound being performed. The ultrasound revealed a small lesion on the right ovary. An MRI conducted last year was normal, but recent ultrasounds suggest the lesion has grown slightly from 16 millimeters to 18 millimeters. The lesion is described as solid w/features suggestive of a BCT.  A recent CA 125 was 11.9.   She underwent an ablation procedure some time ago, which initially helped with the heavy periods, but the effects have since worn off. She experiences heavy periods lasting up to two weeks at a time, but she is not currently receiving treatment for this issue and is not anemic.  She reports bloating but denies any pain, weight loss, or early satiety.   Her family history is significant for several aunts and a half-sister with breast cancer, but there is no known history of ovarian, colon, or uterine cancer in the family.    Review of Systems  Review of Systems  Constitutional:  Negative for malaise/fatigue and weight loss.  Respiratory:  Negative for shortness of breath and wheezing.   Cardiovascular:  Negative for chest pain and leg swelling.  Gastrointestinal:  See above Genitourinary:  Negative for dysuria, frequency, hematuria and urgency.  Musculoskeletal:  Negative for joint pain and myalgias.  Neurological:  Negative for weakness.  Psychiatric/Behavioral:  Negative for depression. The patient does not have insomnia.    Current medications, allergy, social history, past  surgical history, past medical history, family history were all reviewed.    Vitals:  Blood pressure 137/74, pulse 97, temperature 98.3 F (36.8 C), temperature source Oral, resp. rate 17, height 5' 2 (1.575 m), weight 185 lb (83.9 kg), SpO2 98%.  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 Slight interval  growth noted on ultrasound. Normal CA-125 and MRI reduce malignancy concern. - Order repeat MRI of the pelvis. - Schedule follow-up to discuss MRI results.   Heavy menstrual bleeding Possible AUB--A. Previous endometrial ablation effects diminished.  - Discuss management options with Dr. Cloretta Danes after MRI results.    I personally spent 30 minutes face-to-face and non-face-to-face in the care of this patient, which includes all pre, intra, and post visit time on the date of service.     Abdul Hodgkin, MD

## 2024-02-17 NOTE — Patient Instructions (Signed)
 Return for virtual video or phone visit after the pelvic MRI.

## 2024-02-19 ENCOUNTER — Encounter: Payer: Self-pay | Admitting: Obstetrics and Gynecology

## 2024-02-23 ENCOUNTER — Ambulatory Visit (HOSPITAL_COMMUNITY)
Admission: RE | Admit: 2024-02-23 | Discharge: 2024-02-23 | Disposition: A | Discharge status: Home / Self Care | Source: Ambulatory Visit | Attending: Gynecologic Oncology | Admitting: Gynecologic Oncology

## 2024-02-23 DIAGNOSIS — N83209 Unspecified ovarian cyst, unspecified side: Secondary | ICD-10-CM | POA: Insufficient documentation

## 2024-02-23 DIAGNOSIS — N8003 Adenomyosis of the uterus: Secondary | ICD-10-CM | POA: Diagnosis not present

## 2024-02-23 MED ORDER — GADOBUTROL 1 MMOL/ML IV SOLN
8.0000 mL | Freq: Once | INTRAVENOUS | Status: AC | PRN
  Administered 2024-02-23: 8 mL via INTRAVENOUS

## 2024-03-02 ENCOUNTER — Other Ambulatory Visit: Payer: Self-pay | Admitting: Family Medicine

## 2024-03-02 DIAGNOSIS — E119 Type 2 diabetes mellitus without complications: Secondary | ICD-10-CM

## 2024-03-09 ENCOUNTER — Inpatient Hospital Stay: Attending: Obstetrics & Gynecology | Admitting: Obstetrics & Gynecology

## 2024-03-09 NOTE — Progress Notes (Signed)
 [facility] Quality Team Note  Name: Tracey Anderson Date of Birth: [birth year] MRN: [id number] Date: 03/09/2024  [facility] Quality Team has reviewed this patient's chart, please see recommendations below:  [facility] Quality Other; (CHART REVIEWED. PATIENT LAST A1C OUT OF RANGE, NEED A1C LESS THAN 8.0 FOR GAP CLOSURE. PATIENT ALSO NEEDS URINE MICROALBUMIN/CREATININE RATIO AND EGFR COMPLETED FOR GAP CLOSURE.)

## 2024-03-16 ENCOUNTER — Encounter: Payer: Self-pay | Admitting: Family Medicine

## 2024-03-17 MED ORDER — DEXCOM G7 SENSOR MISC: 1 refills | Status: DC

## 2024-03-19 ENCOUNTER — Other Ambulatory Visit: Payer: Self-pay | Admitting: Family Medicine

## 2024-03-31 ENCOUNTER — Encounter: Payer: Self-pay | Admitting: Family Medicine

## 2024-04-04 MED ORDER — DEXCOM G7 SENSOR MISC: 1 refills | Status: DC

## 2024-04-27 ENCOUNTER — Ambulatory Visit: Payer: Self-pay | Admitting: Family Medicine

## 2024-04-27 ENCOUNTER — Ambulatory Visit (INDEPENDENT_AMBULATORY_CARE_PROVIDER_SITE_OTHER): Admitting: Family Medicine

## 2024-04-27 DIAGNOSIS — E1165 Type 2 diabetes mellitus with hyperglycemia: Secondary | ICD-10-CM

## 2024-04-27 DIAGNOSIS — E1159 Type 2 diabetes mellitus with other circulatory complications: Secondary | ICD-10-CM | POA: Diagnosis not present

## 2024-04-27 DIAGNOSIS — E785 Hyperlipidemia, unspecified: Secondary | ICD-10-CM

## 2024-04-27 DIAGNOSIS — I152 Hypertension secondary to endocrine disorders: Secondary | ICD-10-CM

## 2024-04-27 DIAGNOSIS — Z7985 Long-term (current) use of injectable non-insulin antidiabetic drugs: Secondary | ICD-10-CM

## 2024-04-27 LAB — POCT GLYCOSYLATED HEMOGLOBIN (HGB A1C): Hemoglobin A1C: 8 % — AB (ref 4.0–5.6)

## 2024-04-27 LAB — COMPREHENSIVE METABOLIC PANEL WITH GFR
ALT: 19 U/L (ref 0–35)
AST: 18 U/L (ref 0–37)
Albumin: 4.2 g/dL (ref 3.5–5.2)
Alkaline Phosphatase: 56 U/L (ref 39–117)
BUN: 10 mg/dL (ref 6–23)
CO2: 22 meq/L (ref 19–32)
Calcium: 8.7 mg/dL (ref 8.4–10.5)
Chloride: 104 meq/L (ref 96–112)
Creatinine, Ser: 0.51 mg/dL (ref 0.40–1.20)
GFR: 112.29 mL/min (ref >60.00)
Glucose, Bld: 160 mg/dL — ABNORMAL HIGH (ref 70–99)
Potassium: 4.6 meq/L (ref 3.5–5.1)
Sodium: 135 meq/L (ref 135–145)
Total Bilirubin: 0.8 mg/dL (ref 0.2–1.2)
Total Protein: 7.3 g/dL (ref 6.0–8.3)

## 2024-04-27 LAB — LIPID PANEL
Cholesterol: 130 mg/dL (ref 0–200)
HDL: 44.7 mg/dL (ref >39.00)
LDL Cholesterol: 56 mg/dL (ref 0–99)
NonHDL: 85.3
Total CHOL/HDL Ratio: 3
Triglycerides: 145 mg/dL (ref 0.0–149.0)
VLDL: 29 mg/dL (ref 0.0–40.0)

## 2024-04-27 LAB — MICROALBUMIN / CREATININE URINE RATIO
Creatinine,U: 164.8 mg/dL
Microalb Creat Ratio: 11.3 mg/g (ref 0.0–30.0)
Microalb, Ur: 1.9 mg/dL (ref 0.0–1.9)

## 2024-04-27 MED ORDER — VALSARTAN 80 MG PO TABS: 80.0000 mg | ORAL_TABLET | Freq: Every day | ORAL | 1 refills | Status: AC

## 2024-04-27 MED ORDER — METFORMIN HCL ER 750 MG PO TB24: ORAL_TABLET | ORAL | 1 refills | Status: AC

## 2024-04-27 MED ORDER — ATORVASTATIN CALCIUM 20 MG PO TABS: 20.0000 mg | ORAL_TABLET | Freq: Every day | ORAL | 1 refills | Status: AC

## 2024-04-27 MED ORDER — PIOGLITAZONE HCL 15 MG PO TABS: 15.0000 mg | ORAL_TABLET | Freq: Every day | ORAL | 5 refills | Status: DC

## 2024-04-27 MED ORDER — SERTRALINE HCL 50 MG PO TABS: 50.0000 mg | ORAL_TABLET | Freq: Every day | ORAL | 1 refills | Status: AC

## 2024-04-27 NOTE — Progress Notes (Signed)
 Established Patient Office Visit  Subjective   Patient ID: Tracey Anderson, female    DOB: 22-Mar-1976  Age: 48 y.o. MRN: [id number]  Chief Complaint  Patient presents with   Medical Management of Chronic Issues    HPI   Tracey Anderson seen today for medical follow-up.  She has history of hyperlipidemia, type 2 diabetes with recent poor control, hypertension.  Medications reviewed and include atorvastatin , metformin , diltiazem , sertraline , Mounjaro , valsartan .  She has been compliant with medications other than the fact that she had difficulty getting Mounjaro  filled a week ago and has been out for a few days but otherwise compliant.  Her A1c was extremely elevated a year ago at 12.3 and has been steadily improving and down to 8.0 today.  She is aware that our goal is less than 7  She remains on atorvastatin  for hyperlipidemia.  Denies any myalgias.  Blood pressure stable.  No recent dizziness.  She has had previous intolerance with Jardiance  with yeast infection.  Does have history of hepatic steatosis by previous abdominal imaging.  Past Medical History:  Diagnosis Date   Arthritis    Environmental allergies    Gestational diabetes    Hx gestational diabetes    Hypertension    Obesity    Past Surgical History:  Procedure Laterality Date   CHOLECYSTECTOMY  2009   CYSTECTOMY  2003   left breast   INSERTION OF MESH N/A 01/19/2023   Procedure: INSERTION OF MESH;  Surgeon: Vernetta Berg, MD;  Location: [facility];  Service: General;  Laterality: N/A;   UMBILICAL HERNIA REPAIR N/A 01/19/2023   Procedure: OPEN UMBILICAL HERNIA REPAIR WITH MESH;  Surgeon: Vernetta Berg, MD;  Location: [facility];  Service: General;  Laterality: N/A;  LMA    reports that she has never smoked. She has never used smokeless tobacco. She reports that she does not drink alcohol and does not use drugs. family history includes Breast cancer in her maternal aunt; Diabetes in her  maternal grandfather; Heart disease in her paternal grandfather; Hypertension in her maternal grandfather; Stroke in her maternal grandfather. Allergies  Allergen Reactions   Oxycodone -Acetaminophen  Itching   Jardiance  [Empagliflozin ] Other (See Comments)    Yeast vaginitis   Oxycodone  Itching    Review of Systems  Constitutional:  Negative for malaise/fatigue.  Eyes:  Negative for blurred vision.  Respiratory:  Negative for shortness of breath.   Cardiovascular:  Negative for chest pain.  Neurological:  Negative for dizziness, weakness and headaches.      Objective:     There were no vitals taken for this visit. BP Readings from Last 3 Encounters:  02/17/24 137/74  01/26/24 (!) 142/90  11/06/23 138/88   Wt Readings from Last 3 Encounters:  02/17/24 185 lb (83.9 kg)  01/26/24 187 lb 4.8 oz (85 kg)  11/06/23 187 lb 11.2 oz (85.1 kg)      Physical Exam Vitals reviewed.  Constitutional:      General: She is not in acute distress.    Appearance: She is well-developed. She is not ill-appearing.  Eyes:     Pupils: Pupils are equal, round, and reactive to light.  Neck:     Thyroid : No thyromegaly.     Vascular: No JVD.  Cardiovascular:     Rate and Rhythm: Normal rate and regular rhythm.     Heart sounds:     No gallop.  Pulmonary:     Effort: Pulmonary effort is normal. No respiratory  distress.     Breath sounds: Normal breath sounds. No wheezing or rales.  Musculoskeletal:     Cervical back: Neck supple.  Neurological:     Mental Status: She is alert.      Results for orders placed or performed in visit on 04/27/24  POC HgB A1c  Result Value Ref Range   Hemoglobin A1C 8.0 (A) 4.0 - 5.6 %   HbA1c POC (<> result, manual entry)     HbA1c, POC (prediabetic range)     HbA1c, POC (controlled diabetic range)      Last CBC Lab Results  Component Value Date   WBC 17.5 (H) 08/29/2021   HGB 14.1 08/29/2021   HCT 42.2 08/29/2021   MCV 84.9 08/29/2021   MCH  28.4 08/29/2021   RDW 12.3 08/29/2021   PLT 243 08/29/2021   Last metabolic panel Lab Results  Component Value Date   GLUCOSE 187 (H) 04/15/2023   NA 133 (L) 04/15/2023   K 4.0 04/15/2023   CL 99 04/15/2023   CO2 24 04/15/2023   BUN 11 04/15/2023   CREATININE 0.55 04/15/2023   GFR 111.07 04/15/2023   CALCIUM  9.5 04/15/2023   PROT 7.3 04/15/2023   ALBUMIN 4.3 04/15/2023   BILITOT 0.8 04/15/2023   ALKPHOS 64 04/15/2023   AST 17 04/15/2023   ALT 23 04/15/2023   ANIONGAP 11 01/16/2023   Last lipids Lab Results  Component Value Date   CHOL 143 04/15/2023   HDL 53.00 04/15/2023   LDLCALC (H) 01/15/2010    109        Total Cholesterol/HDL:CHD Risk Coronary Heart Disease Risk Table                     Men   Women  1/2 Average Risk   3.4   3.3  Average Risk       5.0   4.4  2 X Average Risk   9.6   7.1  3 X Average Risk  23.4   11.0        Use the calculated Patient Ratio above and the CHD Risk Table to determine the patient's CHD Risk.        ATP III CLASSIFICATION (LDL):  <100     mg/dL   Optimal  899-870  mg/dL   Near or Above                    Optimal  130-159  mg/dL   Borderline  839-810  mg/dL   High  >809     mg/dL   Very High   LDLDIRECT 75.0 04/15/2023   TRIG 259.0 (H) 04/15/2023   CHOLHDL 3 04/15/2023   Last hemoglobin A1c Lab Results  Component Value Date   HGBA1C 8.0 (A) 04/27/2024      The 10-year ASCVD risk score (Arnett DK, et al., 2019) is: 1.5%    Assessment & Plan:   #1 type 2 diabetes suboptimally controlled with A1c 8.0 but this is improving significantly compared with a year ago.  She is currently on top dose of Mounjaro  at 15 mg and takes metformin  extended release.  Previous intolerance with SGLT2 medication (yeast).  We discussed possible addition of low-dose Actos  15 mg daily, especially in view of her hepatic steatosis.  Set up 1-month follow-up.  Continue yearly diabetic eye exam.  Check urine microalbumin screen.  #2 hypertension  stable.  Continue low-sodium diet.  Continue valsartan  and diltiazem .  #3 hyperlipidemia.  Patient on atorvastatin  20 mg daily.  Tolerating well.  Recheck lipid and CMP.   Return in about 3 months (around 07/28/2024).    Wolm Scarlet, MD

## 2024-04-27 NOTE — Patient Instructions (Signed)
 A1C has improved to 8.0.    goal is < 7  Set up 3 month follow up  Add Actos  15 mg once daily.

## 2024-05-05 ENCOUNTER — Encounter: Payer: Self-pay | Admitting: Family Medicine

## 2024-05-06 MED ORDER — FLUCONAZOLE 150 MG PO TABS: 150.0000 mg | ORAL_TABLET | Freq: Once | ORAL | 0 refills | Status: AC

## 2024-07-29 ENCOUNTER — Ambulatory Visit: Admitting: Family Medicine

## 2024-07-29 ENCOUNTER — Encounter: Payer: Self-pay | Admitting: Family Medicine

## 2024-07-29 VITALS — BP 126/80 | HR 89 | Temp 98.2°F | Wt 189.1 lb

## 2024-07-29 DIAGNOSIS — F339 Major depressive disorder, recurrent, unspecified: Secondary | ICD-10-CM

## 2024-07-29 DIAGNOSIS — E785 Hyperlipidemia, unspecified: Secondary | ICD-10-CM | POA: Diagnosis not present

## 2024-07-29 DIAGNOSIS — E1159 Type 2 diabetes mellitus with other circulatory complications: Secondary | ICD-10-CM

## 2024-07-29 DIAGNOSIS — E1165 Type 2 diabetes mellitus with hyperglycemia: Secondary | ICD-10-CM

## 2024-07-29 DIAGNOSIS — I152 Hypertension secondary to endocrine disorders: Secondary | ICD-10-CM

## 2024-07-29 LAB — POCT GLYCOSYLATED HEMOGLOBIN (HGB A1C): Hemoglobin A1C: 7.2 % — AB (ref 4.0–5.6)

## 2024-07-29 MED ORDER — PIOGLITAZONE HCL 15 MG PO TABS: 15.0000 mg | ORAL_TABLET | Freq: Every day | ORAL | 11 refills | Status: AC

## 2024-07-29 MED ORDER — BUPROPION HCL ER (XL) 150 MG PO TB24
150.0000 mg | ORAL_TABLET | Freq: Every day | ORAL | 5 refills | Status: AC
Start: 2024-07-29 — End: ?

## 2024-07-29 NOTE — Patient Instructions (Signed)
 A1C improved to 7.2 (goal < 7)  Set up 6 week follow up.   Take the Wellbutrin  one daily in the morning.

## 2024-07-29 NOTE — Progress Notes (Signed)
 Established Patient Office Visit  Subjective   Patient ID: Tracey Anderson, female    DOB: 26-Mar-1976  Age: 48 y.o. MRN: [id number]  Chief Complaint  Patient presents with   Medical Management of Chronic Issues    HPI   Tracey Anderson is seen today for routine medical follow-up.  Tracey Anderson has history of type 2 diabetes, hyperlipidemia, recurrent depression, hypertension.  Medications reviewed.  Compliant with all.  Tracey Anderson has had some recent issues with increased depression.  No suicidal ideation.  Tracey Anderson feels like Tracey Anderson has difficulty focusing frequently at times and has low motivation and sometimes low energy.  Tracey Anderson finds it particularly difficult to focus sometimes at work.  Regarding her diabetes Tracey Anderson is on top dose of Mounjaro  15 mg and also takes metformin  and Actos .  Tracey Anderson had intolerance with Jardiance  with recurrent yeast infections.  Tracey Anderson had been frustrated with inability to lose additional weight though her A1c's have been steadily improving.  Tracey Anderson has a continuous glucose monitor but may be losing that because of insurance issues this next year  Tracey Anderson has hyperlipidemia treated with atorvastatin .  No myalgias.  Lipids were checked in August and improved.  Past Medical History:  Diagnosis Date   Arthritis    Environmental allergies    Gestational diabetes    Hx gestational diabetes    Hypertension    Obesity    Past Surgical History:  Procedure Laterality Date   CHOLECYSTECTOMY  2009   CYSTECTOMY  2003   left breast   INSERTION OF MESH N/A 01/19/2023   Procedure: INSERTION OF MESH;  Surgeon: Vernetta Berg, MD;  Location: [facility];  Service: General;  Laterality: N/A;   UMBILICAL HERNIA REPAIR N/A 01/19/2023   Procedure: OPEN UMBILICAL HERNIA REPAIR WITH MESH;  Surgeon: Vernetta Berg, MD;  Location: [facility];  Service: General;  Laterality: N/A;  LMA    reports that Tracey Anderson has never smoked. Tracey Anderson has never used smokeless tobacco. Tracey Anderson reports that Tracey Anderson does not  drink alcohol and does not use drugs. family history includes Breast cancer in her maternal aunt; Diabetes in her maternal grandfather; Heart disease in her paternal grandfather; Hypertension in her maternal grandfather; Stroke in her maternal grandfather. Allergies  Allergen Reactions   Oxycodone -Acetaminophen  Itching   Jardiance  [Empagliflozin ] Other (See Comments)    Yeast vaginitis   Oxycodone  Itching    Review of Systems  Constitutional:  Positive for malaise/fatigue.  Eyes:  Negative for blurred vision.  Respiratory:  Negative for shortness of breath.   Cardiovascular:  Negative for chest pain.  Neurological:  Negative for dizziness, weakness and headaches.      Objective:     BP 126/80   Pulse 89   Temp 98.2 F (36.8 C) (Oral)   Wt 189 lb 1.6 oz (85.8 kg)   SpO2 96%   BMI 34.59 kg/m  BP Readings from Last 3 Encounters:  07/29/24 126/80  02/17/24 137/74  01/26/24 (!) 142/90   Wt Readings from Last 3 Encounters:  07/29/24 189 lb 1.6 oz (85.8 kg)  02/17/24 185 lb (83.9 kg)  01/26/24 187 lb 4.8 oz (85 kg)      Physical Exam Vitals reviewed.  Constitutional:      General: Tracey Anderson is not in acute distress.    Appearance: Tracey Anderson is well-developed. Tracey Anderson is not ill-appearing.  Eyes:     Pupils: Pupils are equal, round, and reactive to light.  Neck:     Thyroid : No thyromegaly.  Vascular: No JVD.  Cardiovascular:     Rate and Rhythm: Normal rate and regular rhythm.     Heart sounds:     No gallop.  Pulmonary:     Effort: Pulmonary effort is normal. No respiratory distress.     Breath sounds: Normal breath sounds. No wheezing or rales.  Musculoskeletal:     Cervical back: Neck supple.     Right lower leg: No edema.     Left lower leg: No edema.  Neurological:     Mental Status: Tracey Anderson is alert.      Results for orders placed or performed in visit on 07/29/24  POC HgB A1c  Result Value Ref Range   Hemoglobin A1C 7.2 (A) 4.0 - 5.6 %   HbA1c POC (<> result,  manual entry)     HbA1c, POC (prediabetic range)     HbA1c, POC (controlled diabetic range)        The 10-year ASCVD risk score (Arnett DK, et al., 2019) is: 1.5%    Assessment & Plan:   Problem List Items Addressed This Visit       Unprioritized   Depression, recurrent   Relevant Medications   buPROPion  (WELLBUTRIN  XL) 150 MG 24 hr tablet   Hypertension associated with diabetes (HCC)   Relevant Medications   pioglitazone  (ACTOS ) 15 MG tablet   Dyslipidemia   Other Visit Diagnoses       Type 2 diabetes mellitus with hyperglycemia, without long-term current use of insulin (HCC)    -  Primary   Relevant Medications   pioglitazone  (ACTOS ) 15 MG tablet   Other Relevant Orders   POC HgB A1c (Completed)     Diabetes improving steadily with A1c down to 7.2% today.  Surprisingly, her weight is back up a few pounds.  Tracey Anderson states Tracey Anderson has been fairly diligent with diet.  Tracey Anderson did not tolerate SGLT2 medication.  Discussed importance of regular exercise and 51-month diabetic follow-up  Refill Actos  for 1 year  Tracey Anderson scored 16 on PHQ 9.  Tracey Anderson has had difficulties with focus and energy.  Already on sertraline  50 mg daily.  We discussed adding Wellbutrin  XL 150 mg once daily.  Set up 6-week follow-up.   Return in about 6 weeks (around 09/09/2024).    Wolm Scarlet, MD

## 2024-08-06 ENCOUNTER — Other Ambulatory Visit: Payer: Self-pay | Admitting: Family Medicine

## 2024-08-21 ENCOUNTER — Other Ambulatory Visit: Payer: Self-pay | Admitting: Family Medicine

## 2024-09-04 ENCOUNTER — Encounter: Payer: Self-pay | Admitting: Family Medicine

## 2024-09-05 MED ORDER — FLUCONAZOLE 150 MG PO TABS: 150.0000 mg | ORAL_TABLET | Freq: Once | ORAL | 0 refills | Status: AC

## 2024-09-09 ENCOUNTER — Encounter: Payer: Self-pay | Admitting: Family Medicine

## 2024-09-09 ENCOUNTER — Ambulatory Visit (INDEPENDENT_AMBULATORY_CARE_PROVIDER_SITE_OTHER): Admitting: Family Medicine

## 2024-09-09 VITALS — BP 124/74 | HR 101 | Temp 98.1°F | Wt 190.7 lb

## 2024-09-09 DIAGNOSIS — D229 Melanocytic nevi, unspecified: Secondary | ICD-10-CM

## 2024-09-09 DIAGNOSIS — R202 Paresthesia of skin: Secondary | ICD-10-CM | POA: Diagnosis not present

## 2024-09-09 DIAGNOSIS — F339 Major depressive disorder, recurrent, unspecified: Secondary | ICD-10-CM

## 2024-09-09 NOTE — Progress Notes (Signed)
 "  Established Patient Office Visit  Subjective   Patient ID: ABBAGALE GOGUEN, female    DOB: [birth year]  Age: 49 y.o. MRN: [id number]  Chief Complaint  Patient presents with   Medical Management of Chronic Issues    HPI   Jasira has history of hypertension, type 2 diabetes, recurrent depression, dyslipidemia.  Recent PHQ-9 of 16.  She was on sertraline  50 mg daily.  She described low energy and low motivation difficulty concentrating and worsening depression symptoms.  No suicidal ideation.  We had a Wellbutrin  150 mg daily and she feels like this has helped.  She has improved motivation since last visit.  Feels like her depression is significantly improved.  No side effects from Wellbutrin .  Second issue is bilateral hand tingling especially at night.  Symptoms present for 2 months.  She works for [employer] and does a lot of typing.  Denies any hand weakness.  Symptoms worse at night.  Involves most digits but especially thumb index middle and ring finger.  Denies any neck pain.  Has not tried any wrist splinting.  She has pigmented lesion right cheek.  She states this has been present for several months.  No itching or bleeding.  She relates prior history of atypical nevus left forearm  Past Medical History:  Diagnosis Date   Arthritis    Environmental allergies    Gestational diabetes    Hx gestational diabetes    Hypertension    Obesity    Past Surgical History:  Procedure Laterality Date   CHOLECYSTECTOMY  2009   CYSTECTOMY  2003   left breast   INSERTION OF MESH N/A 01/19/2023   Procedure: INSERTION OF MESH;  Surgeon: Vernetta Berg, MD;  Location: [facility];  Service: General;  Laterality: N/A;   UMBILICAL HERNIA REPAIR N/A 01/19/2023   Procedure: OPEN UMBILICAL HERNIA REPAIR WITH MESH;  Surgeon: Vernetta Berg, MD;  Location: [facility];  Service: General;  Laterality: N/A;  LMA    reports that she has never smoked. She has never  used smokeless tobacco. She reports that she does not drink alcohol and does not use drugs. family history includes Breast cancer in her maternal aunt; Diabetes in her maternal grandfather; Heart disease in her paternal grandfather; Hypertension in her maternal grandfather; Stroke in her maternal grandfather. Allergies[1]  Review of Systems  Constitutional:  Negative for chills, fever and malaise/fatigue.  Eyes:  Negative for blurred vision.  Respiratory:  Negative for shortness of breath.   Cardiovascular:  Negative for chest pain.  Neurological:  Negative for dizziness, focal weakness, weakness and headaches.      Objective:     BP 124/74   Pulse (!) 101   Temp 98.1 F (36.7 C) (Oral)   Wt 190 lb 11.2 oz (86.5 kg)   SpO2 99%   BMI 34.88 kg/m  BP Readings from Last 3 Encounters:  09/09/24 124/74  07/29/24 126/80  02/17/24 137/74   Wt Readings from Last 3 Encounters:  09/09/24 190 lb 11.2 oz (86.5 kg)  07/29/24 189 lb 1.6 oz (85.8 kg)  02/17/24 185 lb (83.9 kg)      Physical Exam Vitals reviewed.  Constitutional:      General: She is not in acute distress.    Appearance: Normal appearance. She is not ill-appearing.  Cardiovascular:     Rate and Rhythm: Normal rate and regular rhythm.  Pulmonary:     Effort: Pulmonary effort is normal.  Breath sounds: Normal breath sounds.  Skin:    Comments: Right cheek reveals 6 x 8 mm somewhat variegated slightly asymmetric skin lesion.  Neurological:     Mental Status: She is alert.     Comments: Full strength upper extremities.  Normal sensory function throughout.      No results found for any visits on 09/09/24.    The 10-year ASCVD risk score (Arnett DK, et al., 2019) is: 1.5%    Assessment & Plan:   #1 recurrent depression.  Recent worsening symptoms and we added Wellbutrin  XL 150 mg daily to her sertraline  50 mg.  She has seen great improvement in symptoms.  Increased energy and motivation.  Continue current  regimen.  Could titrate Wellbutrin  or sertraline  in future if she has any worsening  #2 atypical pigmented lesion right cheek.  Recommend dermatology referral and this was placed today.  #3 paresthesias involving both hands.  Suspect carpal tunnel syndrome.  She has risk factor of increased typing.  We suggested trying wrist splint at night and if she is not seeing improvement in 2 to 3 weeks be in touch and we can consider further workup/referral   Return in about 3 months (around 12/08/2024).    Wolm Scarlet, MD     [1]  Allergies Allergen Reactions   Oxycodone -Acetaminophen  Itching   Jardiance  [Empagliflozin ] Other (See Comments)    Yeast vaginitis   Oxycodone  Itching   "

## 2024-09-12 ENCOUNTER — Encounter: Payer: Self-pay | Admitting: Physician Assistant

## 2024-09-12 ENCOUNTER — Ambulatory Visit: Admitting: Physician Assistant

## 2024-09-12 VITALS — BP 113/67

## 2024-09-12 DIAGNOSIS — L299 Pruritus, unspecified: Secondary | ICD-10-CM

## 2024-09-12 DIAGNOSIS — L821 Other seborrheic keratosis: Secondary | ICD-10-CM | POA: Diagnosis not present

## 2024-09-12 DIAGNOSIS — L918 Other hypertrophic disorders of the skin: Secondary | ICD-10-CM

## 2024-09-12 NOTE — Patient Instructions (Signed)

## 2024-09-12 NOTE — Progress Notes (Signed)
" ° °  New Patient Visit   Subjective  Tracey Anderson is a 49 y.o. female NEW PATIENT who presents for the following: Spot on right cheek that has been there more than a year and has gotten bigger and changed color in the last month.  She has some itchy skin that is most times worse at night. She says there is not usually a rash, just itching. She does have to take benadryl  sometimes at night.   The following portions of the chart were reviewed this encounter and updated as appropriate: medications, allergies, medical history  Review of Systems:  No other skin or systemic complaints except as noted in HPI or Assessment and Plan.  Objective  Well appearing patient in no apparent distress; mood and affect are within normal limits.   A focused examination was performed of the following areas: Face, neck, and arms.    Relevant exam findings are noted in the Assessment and Plan.    Assessment & Plan   SEBORRHEIC KERATOSIS - Stuck-on, waxy, tan-brown papule of left cheek - Benign-appearing - Discussed benign etiology and prognosis. - Observe - SCHEDULE FULL BODY SKIN EXAM SOON    Acrochordons (Skin Tags) - Fleshy, skin-colored pedunculated papules  - Benign appearing.  - Observe. - If desired, they can be removed with an in office procedure that is not covered by insurance. - Please call the clinic if you notice any new or changing lesions.    PRURITUS -- normal appearing skin today  - likely related to Winter months  - can be related to systemic conditions (Thyroid , etc). Recommend routine lab work by PCP.     SEBORRHEIC KERATOSIS   SKIN TAG   PRURITUS    Return for First available with Erminio for [facility].  I, Roseline Hutchinson, CMA, am acting as scribe for Amaan Meyer K, PA-C .   Documentation: I have reviewed the above documentation for accuracy and completeness, and I agree with the above.  Camber Ninh K, PA-C    "

## 2024-09-28 ENCOUNTER — Encounter: Payer: Self-pay | Admitting: Family Medicine

## 2024-12-09 ENCOUNTER — Ambulatory Visit: Admitting: Family Medicine

## 2025-01-03 ENCOUNTER — Ambulatory Visit: Admitting: Physician Assistant
